# Patient Record
Sex: Male | Born: 1955
Health system: Southern US, Community
[De-identification: ages and names within clinical notes are randomized; demographics above are authoritative.]

## PROBLEM LIST (undated history)

## (undated) DIAGNOSIS — G35 Multiple sclerosis: Secondary | ICD-10-CM

## (undated) DIAGNOSIS — R569 Unspecified convulsions: Secondary | ICD-10-CM

## (undated) DIAGNOSIS — F32A Depression, unspecified: Secondary | ICD-10-CM

## (undated) DIAGNOSIS — I8002 Phlebitis and thrombophlebitis of superficial vessels of left lower extremity: Secondary | ICD-10-CM

## (undated) DIAGNOSIS — F329 Major depressive disorder, single episode, unspecified: Secondary | ICD-10-CM

## (undated) HISTORY — DX: Phlebitis and thrombophlebitis of superficial vessels of left lower extremity: I80.02

## (undated) HISTORY — PX: NASAL FRACTURE SURGERY: SHX718

## (undated) HISTORY — DX: Multiple sclerosis: G35

## (undated) HISTORY — DX: Major depressive disorder, single episode, unspecified: F32.9

## (undated) HISTORY — DX: Depression, unspecified: F32.A

## (undated) HISTORY — DX: Unspecified convulsions: R56.9

---

## 2003-04-24 ENCOUNTER — Inpatient Hospital Stay (HOSPITAL_COMMUNITY): Admission: EM | Admit: 2003-04-24 | Discharge: 2003-04-27 | Payer: Self-pay | Admitting: *Deleted

## 2005-04-24 ENCOUNTER — Encounter: Admission: RE | Admit: 2005-04-24 | Discharge: 2005-04-24 | Payer: Self-pay | Admitting: Neurology

## 2012-08-08 ENCOUNTER — Other Ambulatory Visit: Payer: Self-pay | Admitting: Neurology

## 2013-03-18 ENCOUNTER — Encounter: Payer: Self-pay | Admitting: Neurology

## 2013-03-28 ENCOUNTER — Encounter (INDEPENDENT_AMBULATORY_CARE_PROVIDER_SITE_OTHER): Payer: Self-pay

## 2013-03-28 ENCOUNTER — Ambulatory Visit (INDEPENDENT_AMBULATORY_CARE_PROVIDER_SITE_OTHER): Payer: BC Managed Care – PPO | Admitting: Neurology

## 2013-03-28 ENCOUNTER — Encounter: Payer: Self-pay | Admitting: Neurology

## 2013-03-28 VITALS — BP 127/67 | HR 64 | Wt 242.0 lb

## 2013-03-28 DIAGNOSIS — G35 Multiple sclerosis: Secondary | ICD-10-CM

## 2013-03-28 DIAGNOSIS — Z5181 Encounter for therapeutic drug level monitoring: Secondary | ICD-10-CM

## 2013-03-28 DIAGNOSIS — R569 Unspecified convulsions: Secondary | ICD-10-CM

## 2013-03-28 HISTORY — DX: Unspecified convulsions: R56.9

## 2013-03-28 MED ORDER — SERTRALINE HCL 100 MG PO TABS: 150.0000 mg | ORAL_TABLET | Freq: Every day | ORAL | Status: DC

## 2013-03-28 NOTE — Progress Notes (Signed)
Reason for visit: Multiple sclerosis  Troy Carney is an 57 y.o. male  History of present illness:  Troy Carney is a 58 year old right-handed white male with a history of multiple sclerosis. The patient has done quite well on Avonex injection since last seen. The patient has not had any injection site issues or other side effects on the medication. The patient reports no new neurologic complaints of numbness, weakness, balance issues, bowel or bladder control problems, or visual changes. The patient does report some fatigue at times, but this is not a significant issue for him, and he is able to perform all activities of daily living without problems. The patient sleeps well at night. The patient has not had any blood work done over the last one year. The patient returns for an evaluation. The patient has a history of dystonic seizures, and he is on Keppra without recurrence of the seizures.  Past Medical History  Diagnosis Date  . Multiple sclerosis   . Seizure     dystonic  . Seizures 03/28/2013  . Depression     Past Surgical History  Procedure Laterality Date  . Nasal fracture surgery      Family History  Problem Relation Age of Onset  . Bladder Cancer Father   . Testicular cancer Brother   . Asthma Father   . COPD Mother   . Multiple sclerosis Son   . Multiple sclerosis Son     Social history:  reports that he has never smoked. He has never used smokeless tobacco. He reports that he does not drink alcohol or use illicit drugs.   No Known Allergies  Medications:  Current Outpatient Prescriptions on File Prior to Visit  Medication Sig Dispense Refill  . aspirin 325 MG tablet Take 325 mg by mouth daily.      . interferon beta-1a (AVONEX) 30 MCG/0.5ML injection Inject 30 mcg into the muscle every 7 (seven) days.      Marland Kitchen levETIRAcetam (KEPPRA) 750 MG tablet Take 750 mg by mouth 2 (two) times daily. Brand medically necessary       No current facility-administered medications  on file prior to visit.    ROS:  Out of a complete 14 system review of symptoms, the patient complains only of the following symptoms, and all other reviewed systems are negative.  Back pain  Fatigue  Blood pressure 127/67, pulse 64, weight 242 lb (109.77 kg).  Physical Exam  General: The patient is alert and cooperative at the time of the examination. the patient is minimally obese.   Skin: 1+ edema at the ankles is noted bilaterally.    Neurologic Exam  Mental status: The patient is oriented x 3.  Cranial nerves: Facial symmetry is present. Speech is normal, no aphasia or dysarthria is noted. Extraocular movements are full. Visual fields are full. pupils are equal, round, and reactive to light. Discs are flat bilaterally.   Motor: The patient has good strength in all 4 extremities.  Sensory examination: Soft touch sensation on the face, arms, and legs is symmetric.  Coordination: The patient has good finger-nose-finger and heel-to-shin bilaterally.  Gait and station: The patient has a normal gait. Tandem gait is normal. Romberg is negative. No drift is seen.  Reflexes: Deep tendon reflexes are symmetric.   Assessment/Plan:  One. Multiple sclerosis  2. Dystonic seizures  The patient doing quite well at this point. The patient will remain on Avonex, and he will have blood work done today. A prescription was  given for the Zoloft. The patient will followup in one year. The patient will contact our office if new neurologic symptoms are noted. The patient has 2 sons with multiple sclerosis as well, and they are doing well on Gilenya.   Troy Alexanders MD 03/28/2013 1:47 PM  [facility] [address] [place], [place] [address]  Phone [phone] Fax [phone]

## 2013-03-28 NOTE — Patient Instructions (Signed)
Multiple Sclerosis Multiple sclerosis (MS) is a disease of the central nervous system. Its cause is unknown. It is more common in the northern states than in the southern states. There is a higher incidence of MS in women. There is a wide variation in the symptoms (problems) of MS. This is because of the many different ways it affects the central nervous system. It often comes on in episodes or attacks. These attacks may last weeks to months. There may be long periods of nearly no problems between attacks. The main symptoms include visual problems (associated with eye pain), numbness, weakness, and paralysis in extremities (arms/hands and legs/feet). There may also be tremors and problems with balance and walking. The age when MS starts is variable. Advances in medicine continue to improve the treatment of this illness. There is no known cure for MS but there are medications that help. MS is not an inherited illness, although your risk of getting this disease is higher if you have a relative with MS. The best radiologic (x-ray) study for MS is an MRI (magnetic resonance imaging). There are medications available to decrease the number and frequency of attacks. SYMPTOMS  The symptoms of MS are caused by loss of insulation (myelin) of the nerves of the brain. When this happens, brain signals do not get transmitted properly or may not get transmitted at all. Some of the problems caused by this include:   Numbness.  Weakness.  Paralysis in extremities.  Visual problems, eye pain.  Balance problems.  Tremors. DIAGNOSIS  Your caregiver can do studies on you to make this diagnosis. This may include specialized X-rays and spinal fluid studies. HOME CARE INSTRUCTIONS   Take medications as directed by your caregiver. Baclofen is a drug commonly used to reduce muscle spasticity. Steroids are often used for short term relief.  Exercise as directed.  Use physical and occupational therapy as directed by  your caregiver. Careful attention to this medical care can help avoid depression.  See your caregiver if you begin to have problems with depression. This is a common problem in MS. Patients often continue to work many years after the diagnosis of MS. Document Released: 03/07/2000 Document Revised: 06/02/2011 Document Reviewed: 10/14/2006 [employer] Patient Information 2014 [employer].  

## 2013-03-29 LAB — CBC WITH DIFFERENTIAL
BASOS ABS: 0 10*3/uL (ref 0.0–0.2)
Basos: 1 %
EOS ABS: 0.3 10*3/uL (ref 0.0–0.4)
EOS: 5 %
HCT: 41.6 % (ref 37.5–51.0)
Hemoglobin: 14.3 g/dL (ref 12.6–17.7)
Lymphocytes Absolute: 1 10*3/uL (ref 0.7–3.1)
Lymphs: 18 %
MCH: 29.9 pg (ref 26.6–33.0)
MCHC: 34.4 g/dL (ref 31.5–35.7)
MCV: 87 fL (ref 79–97)
MONOS ABS: 0.9 10*3/uL (ref 0.1–0.9)
Monocytes: 16 %
NEUTROS PCT: 60 %
Neutrophils Absolute: 3.5 10*3/uL (ref 1.4–7.0)
PLATELETS: 202 10*3/uL (ref 150–379)
RBC: 4.79 x10E6/uL (ref 4.14–5.80)
RDW: 13 % (ref 12.3–15.4)
WBC: 5.7 10*3/uL (ref 3.4–10.8)

## 2013-03-29 LAB — COMPREHENSIVE METABOLIC PANEL
ALK PHOS: 93 IU/L (ref 39–117)
ALT: 32 IU/L (ref 0–44)
AST: 25 IU/L (ref 0–40)
Albumin/Globulin Ratio: 1.8 (ref 1.1–2.5)
Albumin: 4 g/dL (ref 3.5–5.5)
BUN / CREAT RATIO: 16 (ref 9–20)
BUN: 14 mg/dL (ref 6–24)
CHLORIDE: 105 mmol/L (ref 96–108)
CO2: 26 mmol/L (ref 18–29)
CREATININE: 0.86 mg/dL (ref 0.76–1.27)
Calcium: 8.8 mg/dL (ref 8.7–10.2)
GFR calc Af Amer: 111 mL/min/{1.73_m2} (ref >59)
GFR calc non Af Amer: 96 mL/min/{1.73_m2} (ref >59)
GLOBULIN, TOTAL: 2.2 g/dL (ref 1.5–4.5)
Glucose: 100 mg/dL — ABNORMAL HIGH (ref 65–99)
Potassium: 4.1 mmol/L (ref 3.5–5.2)
SODIUM: 140 mmol/L (ref 134–144)
Total Bilirubin: 0.4 mg/dL (ref 0.0–1.2)
Total Protein: 6.2 g/dL (ref 6.0–8.5)

## 2013-03-30 NOTE — Progress Notes (Signed)
Quick Note:  Spoke to patient and relayed unremarkable blood work results, per Dr. Jannifer Franklin. ______

## 2013-04-07 ENCOUNTER — Encounter: Payer: Self-pay | Admitting: Neurology

## 2013-04-08 ENCOUNTER — Other Ambulatory Visit: Payer: Self-pay | Admitting: Neurology

## 2013-04-08 MED ORDER — LEVETIRACETAM 750 MG PO TABS: 750.0000 mg | ORAL_TABLET | Freq: Two times a day (BID) | ORAL | Status: DC

## 2013-10-01 ENCOUNTER — Other Ambulatory Visit: Payer: Self-pay | Admitting: Neurology

## 2013-11-15 ENCOUNTER — Other Ambulatory Visit: Payer: Self-pay | Admitting: Family Medicine

## 2013-11-15 DIAGNOSIS — R229 Localized swelling, mass and lump, unspecified: Principal | ICD-10-CM

## 2013-11-15 DIAGNOSIS — IMO0002 Reserved for concepts with insufficient information to code with codable children: Secondary | ICD-10-CM

## 2013-11-15 DIAGNOSIS — M7989 Other specified soft tissue disorders: Secondary | ICD-10-CM

## 2013-11-23 ENCOUNTER — Ambulatory Visit
Admission: RE | Admit: 2013-11-23 | Discharge: 2013-11-23 | Disposition: A | Discharge status: Home / Self Care | Payer: Managed Care, Other (non HMO) | Source: Ambulatory Visit | Attending: Family Medicine | Admitting: Family Medicine

## 2013-11-23 DIAGNOSIS — M7989 Other specified soft tissue disorders: Secondary | ICD-10-CM

## 2014-03-27 ENCOUNTER — Telehealth: Payer: Self-pay | Admitting: Neurology

## 2014-03-27 NOTE — Telephone Encounter (Signed)
The patient was recently seen by Marica Otter, the patient is on Gilenya, examination was unremarkable.

## 2014-03-28 ENCOUNTER — Ambulatory Visit (INDEPENDENT_AMBULATORY_CARE_PROVIDER_SITE_OTHER): Payer: Managed Care, Other (non HMO) | Admitting: Neurology

## 2014-03-28 ENCOUNTER — Encounter: Payer: Self-pay | Admitting: Neurology

## 2014-03-28 VITALS — BP 134/70 | HR 59 | Ht 70.0 in | Wt 243.6 lb

## 2014-03-28 DIAGNOSIS — G35 Multiple sclerosis: Secondary | ICD-10-CM

## 2014-03-28 DIAGNOSIS — R569 Unspecified convulsions: Secondary | ICD-10-CM

## 2014-03-28 MED ORDER — LEVETIRACETAM 750 MG PO TABS: 750.0000 mg | ORAL_TABLET | Freq: Two times a day (BID) | ORAL | Status: DC

## 2014-03-28 MED ORDER — SERTRALINE HCL 100 MG PO TABS: 150.0000 mg | ORAL_TABLET | Freq: Every day | ORAL | Status: DC

## 2014-03-28 NOTE — Patient Instructions (Signed)

## 2014-03-28 NOTE — Progress Notes (Signed)
Reason for visit: Multiple sclerosis, seizures  Troy Carney is an 59 y.o. male  History of present illness:  Troy Carney is a 59 year old right-handed white male with multiple sclerosis and seizures. The patient has done well with both of these medical issues. He has not had any clinical exacerbations of his multiple sclerosis over the last year. He recently went to his optometrist, and his eye examination was unremarkable. He remains on Keppra for the seizures, and he has not had any recent recurrence of this. 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 He denies any significant issues with vision, numbness or weakness of the extremities, or difficulty with balance. He denies any visual issues. He has a history of depression, and he is on Zoloft for this. He remains on Avonex, and he is tolerating the medication well.  Past Medical History  Diagnosis Date  . Multiple sclerosis   . Seizure     dystonic  . Seizures 03/28/2013  . Depression     Past Surgical History  Procedure Laterality Date  . Nasal fracture surgery      Family History  Problem Relation Age of Onset  . Bladder Cancer Father   . Testicular cancer Brother   . Asthma Father   . COPD Mother   . Multiple sclerosis Son   . Multiple sclerosis Son     Social history:  reports that he has never smoked. He has never used smokeless tobacco. He reports that he does not drink alcohol or use illicit drugs.   No Known Allergies  Medications:  Current Outpatient Prescriptions on File Prior to Visit  Medication Sig Dispense Refill  . aspirin 325 MG tablet Take 325 mg by mouth daily.    . interferon beta-1a (AVONEX) 30 MCG/0.5ML injection Inject 30 mcg into the muscle every 7 (seven) days.     No current facility-administered medications on file prior to visit.    ROS:  Out of a complete 14 system review of symptoms, the patient complains only of the following symptoms, and all other reviewed systems are  negative.  Cough  Blood pressure 134/70, pulse 59, height 5\' 10"  (1.778 m), weight 243 lb 9.6 oz (110.496 kg).  Physical Exam  General: The patient is alert and cooperative at the time of the examination. The patient is moderately obese.  Skin: No significant peripheral edema is noted.   Neurologic Exam  Mental status: The patient is oriented x 3.  Cranial nerves: Facial symmetry is present. Speech is normal, no aphasia or dysarthria is noted. Extraocular movements are full. Visual fields are full. Pupils are equal, round, and reactive to light. Discs are flat bilaterally.  Motor: The patient has good strength in all 4 extremities.  Sensory examination: Soft touch sensation is symmetric on the face, arms, and legs.  Coordination: The patient has good finger-nose-finger and heel-to-shin bilaterally.  Gait and station: The patient has a normal gait. Tandem gait is normal. Romberg is negative. No drift is seen.  Reflexes: Deep tendon reflexes are symmetric.   Assessment/Plan:  1. Multiple sclerosis  2. Seizures  3. Depression  The patient is doing relatively well currently. We will continue the Zoloft, Avonex, and Keppra. He will have blood work done today, and he will be set up for a repeat MRI of the brain for follow-up. The last MRI the brain was done in 2007. He will follow-up in one year or sooner if needed.  Jill Alexanders MD 03/28/2014 7:03 PM  [facility] [address] [place], [place] [address]  Phone [phone] Fax [phone]

## 2014-03-29 LAB — COMPREHENSIVE METABOLIC PANEL WITH GFR
ALT: 38 [IU]/L (ref 0–44)
AST: 29 [IU]/L (ref 0–40)
Albumin/Globulin Ratio: 1.6 (ref 1.1–2.5)
Albumin: 4.3 g/dL (ref 3.5–5.5)
Alkaline Phosphatase: 110 [IU]/L (ref 39–117)
BUN/Creatinine Ratio: 11 (ref 9–20)
BUN: 10 mg/dL (ref 6–24)
CO2: 25 mmol/L (ref 18–29)
Calcium: 9.1 mg/dL (ref 8.7–10.2)
Chloride: 103 mmol/L (ref 97–108)
Creatinine, Ser: 0.95 mg/dL (ref 0.76–1.27)
GFR calc Af Amer: 102 mL/min/{1.73_m2}
GFR calc non Af Amer: 88 mL/min/{1.73_m2}
Globulin, Total: 2.7 g/dL (ref 1.5–4.5)
Glucose: 86 mg/dL (ref 65–99)
Potassium: 4.3 mmol/L (ref 3.5–5.2)
Sodium: 141 mmol/L (ref 134–144)
Total Bilirubin: 0.4 mg/dL (ref 0.0–1.2)
Total Protein: 7 g/dL (ref 6.0–8.5)

## 2014-03-29 LAB — CBC WITH DIFFERENTIAL
Basophils Absolute: 0 10*3/uL (ref 0.0–0.2)
Basos: 1 %
Eos: 4 %
Eosinophils Absolute: 0.3 10*3/uL (ref 0.0–0.4)
HCT: 45.3 % (ref 37.5–51.0)
Hemoglobin: 15.3 g/dL (ref 12.6–17.7)
Immature Grans (Abs): 0 10*3/uL (ref 0.0–0.1)
Immature Granulocytes: 0 %
Lymphocytes Absolute: 1.9 10*3/uL (ref 0.7–3.1)
Lymphs: 30 %
MCH: 29.5 pg (ref 26.6–33.0)
MCHC: 33.8 g/dL (ref 31.5–35.7)
MCV: 88 fL (ref 79–97)
Monocytes Absolute: 1.2 10*3/uL — ABNORMAL HIGH (ref 0.1–0.9)
Monocytes: 19 %
Neutrophils Absolute: 2.9 10*3/uL (ref 1.4–7.0)
Neutrophils Relative %: 46 %
Platelets: 231 10*3/uL (ref 150–379)
RBC: 5.18 x10E6/uL (ref 4.14–5.80)
RDW: 13.5 % (ref 12.3–15.4)
WBC: 6.3 10*3/uL (ref 3.4–10.8)

## 2014-04-06 ENCOUNTER — Ambulatory Visit (INDEPENDENT_AMBULATORY_CARE_PROVIDER_SITE_OTHER): Payer: Managed Care, Other (non HMO)

## 2014-04-06 DIAGNOSIS — R569 Unspecified convulsions: Secondary | ICD-10-CM

## 2014-04-06 DIAGNOSIS — G35 Multiple sclerosis: Secondary | ICD-10-CM

## 2014-04-07 MED ORDER — GADOPENTETATE DIMEGLUMINE 469.01 MG/ML IV SOLN: 20.0000 mL | Freq: Once | INTRAVENOUS | Status: AC | PRN

## 2014-04-08 ENCOUNTER — Telehealth: Payer: Self-pay | Admitting: Neurology

## 2014-04-08 NOTE — Telephone Encounter (Signed)
The MRI of the brain appears to be stable from the prior study. Results sent to patient by Mychart.   MRI brain 04/07/14:  IMPRESSION:  Mildly abnormal MRI brain (with and without) demonstrating: 1. Few small periventricular and subcortical foci of T2 hyperintensities, consistent with chronic multiple sclerosis. No abnormal lesions are seen on post contrast views.  2. No significant change from MRI on 04/24/05.

## 2014-06-03 ENCOUNTER — Other Ambulatory Visit: Payer: Self-pay | Admitting: Neurology

## 2014-10-02 ENCOUNTER — Other Ambulatory Visit: Payer: Self-pay

## 2014-10-02 MED ORDER — INTERFERON BETA-1A 30 MCG/0.5ML IM PSKT: 30.0000 ug | PREFILLED_SYRINGE | INTRAMUSCULAR | Status: DC

## 2015-03-05 ENCOUNTER — Telehealth: Payer: Self-pay | Admitting: Neurology

## 2015-03-05 NOTE — Telephone Encounter (Signed)
The patient was seen by Dr. Marica Otter, ophthalmologic evaluation showed no neurologic changes in the retina or disc associated with MS. Examination date was on 02/28/2015.

## 2015-03-16 ENCOUNTER — Other Ambulatory Visit: Payer: Self-pay | Admitting: Neurology

## 2015-03-17 ENCOUNTER — Other Ambulatory Visit: Payer: Self-pay | Admitting: Neurology

## 2015-03-29 ENCOUNTER — Encounter: Payer: Self-pay | Admitting: Neurology

## 2015-03-29 ENCOUNTER — Ambulatory Visit (INDEPENDENT_AMBULATORY_CARE_PROVIDER_SITE_OTHER): Payer: Managed Care, Other (non HMO) | Admitting: Neurology

## 2015-03-29 VITALS — BP 132/72 | HR 59 | Temp 97.1°F | Resp 18 | Ht 70.0 in | Wt 242.0 lb

## 2015-03-29 DIAGNOSIS — S39012A Strain of muscle, fascia and tendon of lower back, initial encounter: Secondary | ICD-10-CM | POA: Diagnosis not present

## 2015-03-29 DIAGNOSIS — R569 Unspecified convulsions: Secondary | ICD-10-CM | POA: Diagnosis not present

## 2015-03-29 DIAGNOSIS — Z5181 Encounter for therapeutic drug level monitoring: Secondary | ICD-10-CM | POA: Diagnosis not present

## 2015-03-29 DIAGNOSIS — G35 Multiple sclerosis: Secondary | ICD-10-CM

## 2015-03-29 MED ORDER — SERTRALINE HCL 100 MG PO TABS: 150.0000 mg | ORAL_TABLET | Freq: Every day | ORAL | Status: DC

## 2015-03-29 MED ORDER — LEVETIRACETAM 750 MG PO TABS: 750.0000 mg | ORAL_TABLET | Freq: Two times a day (BID) | ORAL | Status: DC

## 2015-03-29 MED ORDER — METHOCARBAMOL 500 MG PO TABS: 500.0000 mg | ORAL_TABLET | Freq: Three times a day (TID) | ORAL | Status: DC

## 2015-03-29 NOTE — Progress Notes (Signed)
Reason for visit: Multiple sclerosis  Troy Carney is an 60 y.o. male  History of present illness:  Troy Carney is a 60 year old right-handed white male with a history of multiple sclerosis and a history of seizures. The patient indicates that he has done well over the last year with both of these issues. He has not had any new numbness, weakness, gait disturbance, or visual complaints. He was seen by his optometrist on 02/28/2015, the examination was unremarkable. The patient remains on Avonex, he is tolerating the medication well. He reports no further seizure events, he is operating a motor vehicle. The patient reports some mild fatigue problems. Occasionally, he may stumble, but he has not had any falls. Two weeks ago, he strained his low back, and the pain has continued. He denies any discomfort down either leg, but the pain is in the back. He has some back stiffness. He takes Advil on occasion for discomfort. He returns for an evaluation.  Past Medical History  Diagnosis Date  . Multiple sclerosis ([place])   . Seizure ([place])     dystonic  . Seizures ([place]) 03/28/2013  . Depression     Past Surgical History  Procedure Laterality Date  . Nasal fracture surgery      Family History  Problem Relation Age of Onset  . Bladder Cancer Father   . Testicular cancer Brother   . Asthma Father   . COPD Mother   . Multiple sclerosis Son   . Multiple sclerosis Son     Social history:  reports that he has never smoked. He has never used smokeless tobacco. He reports that he does not drink alcohol or use illicit drugs.   No Known Allergies  Medications:  Prior to Admission medications   Medication Sig Start Date End Date Taking? Authorizing Provider  aspirin 325 MG tablet Take 325 mg by mouth daily.   Yes Historical Provider, MD  interferon beta-1a (AVONEX PREFILLED) 30 MCG/0.5ML PSKT injection Inject 0.5 mLs (30 mcg total) into the muscle every 7 (seven) days. 10/02/14  Yes Kathrynn Ducking,  MD  levETIRAcetam (KEPPRA) 750 MG tablet TAKE 1 TABLET BY MOUTH TWICE DAILY 03/17/15  Yes Kathrynn Ducking, MD  sertraline (ZOLOFT) 100 MG tablet Take 1.5 tablets (150 mg total) by mouth daily. 03/28/14  Yes Kathrynn Ducking, MD    ROS:  Out of a complete 14 system review of symptoms, the patient complains only of the following symptoms, and all other reviewed systems are negative.  Back pain  Blood pressure 132/72, pulse 59, temperature 97.1 F (36.2 C), resp. rate 18, height 5\' 10"  (1.778 m), weight 242 lb (109.77 kg).  Physical Exam  General: The patient is alert and cooperative at the time of the examination. The patient is moderately obese.  Skin: No significant peripheral edema is noted.   Neurologic Exam  Mental status: The patient is alert and oriented x 3 at the time of the examination. The patient has apparent normal recent and remote memory, with an apparently normal attention span and concentration ability.   Cranial nerves: Facial symmetry is present. Speech is normal, no aphasia or dysarthria is noted. Extraocular movements are full. Visual fields are full. Pupils are equal, round, and reactive to light. Discs are flat bilaterally.  Motor: The patient has good strength in all 4 extremities.  Sensory examination: Soft touch sensation is symmetric on the face, arms, and legs.  Coordination: The patient has good finger-nose-finger and heel-to-shin bilaterally.  Gait and station: The patient has a normal gait. Tandem gait is normal. Romberg is negative. No drift is seen.  Reflexes: Deep tendon reflexes are symmetric.   Assessment/Plan:  1. Multiple sclerosis  2. History of seizures, well controlled  3. Lumbar strain, recent onset  The patient is doing well with his multiple sclerosis, we will continue Avonex for now. The patient was called in a prescription for his Keppra and for his Zoloft. The patient has recently injured his low back, he has ongoing back  pain. He will be placed on Robaxin 500 mg 3 times daily for the next 10 days, he will take Advil on a scheduled basis taking 600 mg 3 times a day with meals. He will contact me if the back pain continues, we will consider physical therapy at that time. He will follow-up in one year, sooner if needed. Blood work will be done today.  Jill Alexanders MD 03/29/2015 6:48 PM  [facility] [address] [place], [place] [address]  Phone [phone] Fax [phone]

## 2015-03-29 NOTE — Patient Instructions (Signed)
Multiple Sclerosis °Multiple sclerosis (MS) is a disease of the central nervous system. It leads to the loss of the insulating covering of the nerves (myelin sheath) of your brain. When this happens, brain signals do not get sent properly or may not get sent at all. The age of onset of MS varies.  °CAUSES °The cause of MS is unknown. However, it is more common in the northern [place] than in the southern [place]. °RISK FACTORS °There is a higher number of women with MS than men. MS is not an illness that is passed down to you from your family members (inherited). However, your risk of MS is higher if you have a relative with MS. °SIGNS AND SYMPTOMS  °The symptoms of MS occur in episodes or attacks. These attacks may last weeks to months. There may be long periods of almost no symptoms between attacks. The symptoms of MS vary. This is because of the many different ways it affects the central nervous system. The main symptoms of MS include: °· Vision problems and eye pain. °· Numbness. °· Weakness. °· Inability to move your arms, hands, feet, or legs (paralysis). °· Balance problems. °· Tremors. °DIAGNOSIS  °Your health care provider can diagnose MS with the help of imaging exams and lab tests. These may include specialized X-ray exams and spinal fluid tests. The best imaging exam to confirm a diagnosis of MS is an MRI. °TREATMENT  °There is no known cure for MS, but there are medicines that can decrease the number and frequency of attacks. Steroids are often used for short-term relief. Physical and occupational therapy may also help. There are also many new alternative or complementary treatments available to help control the symptoms of MS. Ask your health care provider if any of these other options are right for you. °HOME CARE INSTRUCTIONS  °· Take medicines as directed by your health care provider. °· Exercise as directed by your health care provider. °SEEK MEDICAL CARE IF: °You begin to feel  depressed. °SEEK IMMEDIATE MEDICAL CARE IF: °· You develop paralysis. °· You have problems with bladder, bowel, or sexual function. °· You develop mental changes, such as forgetfulness or mood swings. °· You have a period of uncontrolled movements (seizure). °  °This information is not intended to replace advice given to you by your health care provider. Make sure you discuss any questions you have with your health care provider. °  °Document Released: 03/07/2000 Document Revised: 03/15/2013 Document Reviewed: 11/15/2012 °Elsevier Interactive Patient Education ©2016 [employer] ° °

## 2015-03-30 LAB — CBC WITH DIFFERENTIAL/PLATELET
BASOS ABS: 0 10*3/uL (ref 0.0–0.2)
BASOS: 1 %
EOS (ABSOLUTE): 0.4 10*3/uL (ref 0.0–0.4)
Eos: 5 %
Hematocrit: 44.8 % (ref 37.5–51.0)
Hemoglobin: 14.8 g/dL (ref 12.6–17.7)
Immature Grans (Abs): 0 10*3/uL (ref 0.0–0.1)
Immature Granulocytes: 1 %
LYMPHS ABS: 2 10*3/uL (ref 0.7–3.1)
Lymphs: 31 %
MCH: 29.6 pg (ref 26.6–33.0)
MCHC: 33 g/dL (ref 31.5–35.7)
MCV: 90 fL (ref 79–97)
MONOS ABS: 0.7 10*3/uL (ref 0.1–0.9)
Monocytes: 11 %
NEUTROS ABS: 3.4 10*3/uL (ref 1.4–7.0)
Neutrophils: 51 %
PLATELETS: 229 10*3/uL (ref 150–379)
RBC: 5 x10E6/uL (ref 4.14–5.80)
RDW: 13.4 % (ref 12.3–15.4)
WBC: 6.5 10*3/uL (ref 3.4–10.8)

## 2015-03-30 LAB — COMPREHENSIVE METABOLIC PANEL
ALK PHOS: 95 IU/L (ref 39–117)
ALT: 30 IU/L (ref 0–44)
AST: 21 IU/L (ref 0–40)
Albumin/Globulin Ratio: 1.9 (ref 1.1–2.5)
Albumin: 4.2 g/dL (ref 3.5–5.5)
BILIRUBIN TOTAL: 0.4 mg/dL (ref 0.0–1.2)
BUN/Creatinine Ratio: 19 (ref 9–20)
BUN: 16 mg/dL (ref 6–24)
CHLORIDE: 99 mmol/L (ref 96–106)
CO2: 23 mmol/L (ref 18–29)
Calcium: 9.1 mg/dL (ref 8.7–10.2)
Creatinine, Ser: 0.85 mg/dL (ref 0.76–1.27)
GFR calc non Af Amer: 95 mL/min/{1.73_m2} (ref >59)
GFR, EST AFRICAN AMERICAN: 110 mL/min/{1.73_m2} (ref >59)
GLUCOSE: 90 mg/dL (ref 65–99)
Globulin, Total: 2.2 g/dL (ref 1.5–4.5)
Potassium: 4.1 mmol/L (ref 3.5–5.2)
Sodium: 139 mmol/L (ref 134–144)
TOTAL PROTEIN: 6.4 g/dL (ref 6.0–8.5)

## 2015-05-28 ENCOUNTER — Other Ambulatory Visit: Payer: Self-pay | Admitting: *Deleted

## 2015-05-28 MED ORDER — INTERFERON BETA-1A 30 MCG/0.5ML IM PSKT: 30.0000 ug | PREFILLED_SYRINGE | INTRAMUSCULAR | Status: DC

## 2015-05-28 NOTE — Telephone Encounter (Signed)
Avonex r/f per faxed request/fim

## 2015-11-21 ENCOUNTER — Other Ambulatory Visit: Payer: Self-pay | Admitting: *Deleted

## 2015-11-21 MED ORDER — INTERFERON BETA-1A 30 MCG/0.5ML IM PSKT: 30.0000 ug | PREFILLED_SYRINGE | INTRAMUSCULAR | 1 refills | Status: DC

## 2016-03-28 ENCOUNTER — Ambulatory Visit (INDEPENDENT_AMBULATORY_CARE_PROVIDER_SITE_OTHER): Payer: Managed Care, Other (non HMO) | Admitting: Neurology

## 2016-03-28 ENCOUNTER — Encounter: Payer: Self-pay | Admitting: Neurology

## 2016-03-28 VITALS — Ht 70.0 in | Wt 241.5 lb

## 2016-03-28 DIAGNOSIS — G35 Multiple sclerosis: Secondary | ICD-10-CM

## 2016-03-28 DIAGNOSIS — R569 Unspecified convulsions: Secondary | ICD-10-CM

## 2016-03-28 DIAGNOSIS — Z5181 Encounter for therapeutic drug level monitoring: Secondary | ICD-10-CM | POA: Diagnosis not present

## 2016-03-28 MED ORDER — LEVETIRACETAM 750 MG PO TABS: 750.0000 mg | ORAL_TABLET | Freq: Two times a day (BID) | ORAL | 3 refills | Status: DC

## 2016-03-28 MED ORDER — SERTRALINE HCL 100 MG PO TABS: 150.0000 mg | ORAL_TABLET | Freq: Every day | ORAL | 3 refills | Status: DC

## 2016-03-28 NOTE — Progress Notes (Signed)
Reason for visit: Multiple sclerosis, seizures  Troy Carney is an 61 y.o. male  History of present illness:  Troy Carney is a 61 year old right-handed white male with a history of seizures that have been well controlled. The patient is on Keppra and he is tolerating the medication well. He has not had any recurrence since last seen, he does operate a motor vehicle. The patient has a history multiple sclerosis that has been very stable on Avonex. The patient is tolerating this medication well. He denies any new symptoms of numbness, weakness, balance issues, or changes in mentation. His overall energy level is relatively good. He returns for routine reevaluation. He has had a recent ophthalmologic evaluation.    Past Medical History:  Diagnosis Date  . Depression   . Multiple sclerosis ([place])   . Seizures ([place]) 03/28/2013   Dystonic    Past Surgical History:  Procedure Laterality Date  . NASAL FRACTURE SURGERY      Family History  Problem Relation Age of Onset  . COPD Mother   . Bladder Cancer Father   . Asthma Father   . Testicular cancer Brother   . Multiple sclerosis Son   . Multiple sclerosis Son     Social history:  reports that he has never smoked. He has never used smokeless tobacco. He reports that he does not drink alcohol or use drugs.   No Known Allergies  Medications:  Prior to Admission medications   Medication Sig Start Date End Date Taking? Authorizing Provider  aspirin 81 MG tablet Take 81 mg by mouth daily.    Yes Historical Provider, MD  interferon beta-1a (AVONEX PREFILLED) 30 MCG/0.5ML PSKT injection Inject 0.5 mLs (30 mcg total) into the muscle every 7 (seven) days. 11/21/15  Yes Kathrynn Ducking, MD  levETIRAcetam (KEPPRA) 750 MG tablet Take 1 tablet (750 mg total) by mouth 2 (two) times daily. 03/28/16  Yes Kathrynn Ducking, MD  sertraline (ZOLOFT) 100 MG tablet Take 1.5 tablets (150 mg total) by mouth daily. 03/28/16  Yes Kathrynn Ducking, MD     ROS:  Out of a complete 14 system review of symptoms, the patient complains only of the following symptoms, and all other reviewed systems are negative.  Food allergies Snoring  Height 5\' 10"  (1.778 m), weight 241 lb 8 oz (109.5 kg).  Physical Exam  General: The patient is alert and cooperative at the time of the examination. The patient is moderately obese.  Skin: No significant peripheral edema is noted.   Neurologic Exam  Mental status: The patient is alert and oriented x 3 at the time of the examination. The patient has apparent normal recent and remote memory, with an apparently normal attention span and concentration ability.   Cranial nerves: Facial symmetry is present. Speech is normal, no aphasia or dysarthria is noted. Extraocular movements are full. Visual fields are full. Pupils are equal, round, and reactive to light. Discs are flat bilaterally.  Motor: The patient has good strength in all 4 extremities.  Sensory examination: Soft touch sensation is symmetric on the face, arms, and legs.  Coordination: The patient has good finger-nose-finger and heel-to-shin bilaterally.  Gait and station: The patient has a normal gait. Tandem gait is normal. Romberg is negative. No drift is seen.  Reflexes: Deep tendon reflexes are symmetric.   Assessment/Plan:  1. Multiple sclerosis  2. Seizures  The patient has been doing quite well with both the multiple sclerosis and the seizure control. The  patient will be given prescriptions for Keppra and Zoloft, he will continue the Avonex. He will follow-up in one year, sooner if needed. Blood work will be done today.  Jill Alexanders MD 03/28/2016 8:18 AM  [facility] [address] [place], [place] [address]  Phone [phone] Fax [phone]

## 2016-03-29 LAB — COMPREHENSIVE METABOLIC PANEL
A/G RATIO: 1.5 (ref 1.2–2.2)
ALT: 29 IU/L (ref 0–44)
AST: 23 IU/L (ref 0–40)
Albumin: 4.1 g/dL (ref 3.6–4.8)
Alkaline Phosphatase: 94 IU/L (ref 39–117)
BILIRUBIN TOTAL: 0.3 mg/dL (ref 0.0–1.2)
BUN/Creatinine Ratio: 24 (ref 10–24)
BUN: 21 mg/dL (ref 8–27)
CHLORIDE: 102 mmol/L (ref 96–106)
CO2: 23 mmol/L (ref 18–29)
Calcium: 9.4 mg/dL (ref 8.6–10.2)
Creatinine, Ser: 0.86 mg/dL (ref 0.76–1.27)
GFR calc non Af Amer: 94 mL/min/{1.73_m2} (ref >59)
GFR, EST AFRICAN AMERICAN: 109 mL/min/{1.73_m2} (ref >59)
GLOBULIN, TOTAL: 2.7 g/dL (ref 1.5–4.5)
Glucose: 83 mg/dL (ref 65–99)
POTASSIUM: 4.2 mmol/L (ref 3.5–5.2)
SODIUM: 141 mmol/L (ref 134–144)
TOTAL PROTEIN: 6.8 g/dL (ref 6.0–8.5)

## 2016-03-29 LAB — CBC WITH DIFFERENTIAL/PLATELET
BASOS ABS: 0 10*3/uL (ref 0.0–0.2)
Basos: 1 %
EOS (ABSOLUTE): 0.3 10*3/uL (ref 0.0–0.4)
Eos: 5 %
Hematocrit: 45.4 % (ref 37.5–51.0)
Hemoglobin: 15 g/dL (ref 13.0–17.7)
IMMATURE GRANS (ABS): 0 10*3/uL (ref 0.0–0.1)
Immature Granulocytes: 1 %
LYMPHS ABS: 1.9 10*3/uL (ref 0.7–3.1)
LYMPHS: 29 %
MCH: 29.9 pg (ref 26.6–33.0)
MCHC: 33 g/dL (ref 31.5–35.7)
MCV: 91 fL (ref 79–97)
MONOS ABS: 0.8 10*3/uL (ref 0.1–0.9)
Monocytes: 12 %
NEUTROS ABS: 3.3 10*3/uL (ref 1.4–7.0)
Neutrophils: 52 %
PLATELETS: 228 10*3/uL (ref 150–379)
RBC: 5.01 x10E6/uL (ref 4.14–5.80)
RDW: 13.7 % (ref 12.3–15.4)
WBC: 6.3 10*3/uL (ref 3.4–10.8)

## 2016-05-15 ENCOUNTER — Other Ambulatory Visit: Payer: Self-pay | Admitting: Neurology

## 2017-03-28 ENCOUNTER — Other Ambulatory Visit: Payer: Self-pay | Admitting: Neurology

## 2017-03-30 ENCOUNTER — Ambulatory Visit (INDEPENDENT_AMBULATORY_CARE_PROVIDER_SITE_OTHER): Payer: Managed Care, Other (non HMO) | Admitting: Adult Health

## 2017-03-30 ENCOUNTER — Encounter: Payer: Self-pay | Admitting: Adult Health

## 2017-03-30 VITALS — BP 137/70 | HR 55 | Ht 70.0 in | Wt 240.0 lb

## 2017-03-30 DIAGNOSIS — R569 Unspecified convulsions: Secondary | ICD-10-CM

## 2017-03-30 DIAGNOSIS — G35 Multiple sclerosis: Secondary | ICD-10-CM | POA: Diagnosis not present

## 2017-03-30 MED ORDER — INTERFERON BETA-1A 30 MCG/0.5ML IM PSKT: 30.0000 ug | PREFILLED_SYRINGE | INTRAMUSCULAR | 3 refills | Status: DC

## 2017-03-30 MED ORDER — SERTRALINE HCL 100 MG PO TABS: 150.0000 mg | ORAL_TABLET | Freq: Every day | ORAL | 3 refills | Status: DC

## 2017-03-30 MED ORDER — LEVETIRACETAM 750 MG PO TABS: 750.0000 mg | ORAL_TABLET | Freq: Two times a day (BID) | ORAL | 3 refills | Status: DC

## 2017-03-30 NOTE — Patient Instructions (Addendum)
Your Plan:  Continue Keppra for seizures Continue avonex for Multiple sclerosis MRI brain Blood work today If your symptoms worsen or you develop new symptoms please let us know.   Thank you for coming to see [place] at [facility]. I hope we have been able to provide you high quality care today.  You may receive a patient satisfaction survey over the next few weeks. We would appreciate your feedback and comments so that we may continue to improve ourselves and the health of our patients.

## 2017-03-30 NOTE — Progress Notes (Signed)
I have read the note, and I agree with the clinical assessment and plan.  Lonny Eisen K Rupert Azzara   

## 2017-03-30 NOTE — Progress Notes (Signed)
PATIENT: Troy Carney DOB: 05-13-1955  REASON FOR VISIT: follow up- seizures, multiple sclerosis HISTORY FROM: patient  HISTORY OF PRESENT ILLNESS: Today 03/30/17 Troy Carney is a 62 year old male with a history of seizures and multiple sclerosis.  He returns today for follow-up.  He remains on Keppra for seizure prevention.  He denies any seizure events.  He operates a Teacher, music without difficulty.  He is able to complete all ADLs independently.  He continues on Avonex for multiple sclerosis.  He denies any new numbness or weakness.  Denies any changes in his gait or balance.  Denies any changes with bowel or bladder.  No change in mood or behavior.  He remains on Zoloft 150 mg daily.  No change in his vision.  Overall he feels that he is doing well.  His last MRI of the brain was in 2016.  He returns today for evaluation.   HISTORY Troy Carney is a 62 year old right-handed white male with a history of seizures that have been well controlled. The patient is on Keppra and he is tolerating the medication well. He has not had any recurrence since last seen, he does operate a motor vehicle. The patient has a history multiple sclerosis that has been very stable on Avonex. The patient is tolerating this medication well. He denies any new symptoms of numbness, weakness, balance issues, or changes in mentation. His overall energy level is relatively good. He returns for routine reevaluation. He has had a recent ophthalmologic evaluation.  REVIEW OF SYSTEMS: Out of a complete 14 system review of symptoms, the patient complains only of the following symptoms, and all other reviewed systems are negative.  Snoring  ALLERGIES: No Known Allergies  HOME MEDICATIONS: Outpatient Medications Prior to Visit  Medication Sig Dispense Refill  . aspirin 81 MG tablet Take 81 mg by mouth daily.     Elisha Ponder PREFILLED 30 MCG/0.5ML PSKT injection INJECT 0.5 MLS (30 MCG TOTAL) INTO THE MUSCLE EVERY 7 (SEVEN) DAYS. 3  each 3  . levETIRAcetam (KEPPRA) 750 MG tablet Take 1 tablet (750 mg total) by mouth 2 (two) times daily. 180 tablet 3  . sertraline (ZOLOFT) 100 MG tablet Take 1.5 tablets (150 mg total) by mouth daily. 150 tablet 3   No facility-administered medications prior to visit.     PAST MEDICAL HISTORY: Past Medical History:  Diagnosis Date  . Depression   . Multiple sclerosis ([place])   . Seizures ([place]) 03/28/2013   Dystonic    PAST SURGICAL HISTORY: Past Surgical History:  Procedure Laterality Date  . NASAL FRACTURE SURGERY      FAMILY HISTORY: Family History  Problem Relation Age of Onset  . COPD Mother   . Bladder Cancer Father   . Asthma Father   . Testicular cancer Brother   . Multiple sclerosis Son   . Multiple sclerosis Son     SOCIAL HISTORY: Social History   Socioeconomic History  . Marital status: Married    Spouse name: [name]  . Number of children: 2  . Years of education: 50  . Highest education level: Not on file  Social Needs  . Financial resource strain: Not on file  . Food insecurity - worry: Not on file  . Food insecurity - inability: Not on file  . Transportation needs - medical: Not on file  . Transportation needs - non-medical: Not on file  Occupational History  . Occupation: Chief Financial Officer  Tobacco Use  . Smoking status: Never Smoker  .  Smokeless tobacco: Never Used  Substance and Sexual Activity  . Alcohol use: No  . Drug use: No  . Sexual activity: Not on file  Other Topics Concern  . Not on file  Social History Narrative   Patient is married [name]) and lives at home with his wife.   Patient has two children.   Patient is an Furniture conservator/restorer.   Patient is right-handed.   Patient drinks three caffeine drinks daily.      PHYSICAL EXAM  Vitals:   03/30/17 0724  BP: 137/70  Pulse: (!) 55  Weight: 240 lb (108.9 kg)  Height: 5\' 10"  (1.778 m)   Body mass index is 34.44 kg/m.  Generalized: Well developed, in no acute  distress   Neurological examination  Mentation: Alert oriented to time, place, history taking. Follows all commands speech and language fluent Cranial nerve II-XII: Pupils were equal round reactive to light. Extraocular movements were full, visual field were full on confrontational test. Facial sensation and strength were normal. Uvula tongue midline. Head turning and shoulder shrug  were normal and symmetric. Motor: The motor testing reveals 5 over 5 strength of all 4 extremities. Good symmetric motor tone is noted throughout.  Sensory: Sensory testing is intact to soft touch on all 4 extremities. No evidence of extinction is noted.  Coordination: Cerebellar testing reveals good finger-nose-finger and heel-to-shin bilaterally.  Gait and station: Gait is normal. Tandem gait is normal. Romberg is negative. No drift is seen.  Reflexes: Deep tendon reflexes are symmetric and normal bilaterally.   DIAGNOSTIC DATA (LABS, IMAGING, TESTING) - I reviewed patient records, labs, notes, testing and imaging myself where available.  Lab Results  Component Value Date   WBC 6.3 03/28/2016   HGB 15.0 03/28/2016   HCT 45.4 03/28/2016   MCV 91 03/28/2016   PLT 228 03/28/2016      Component Value Date/Time   NA 141 03/28/2016 0822   K 4.2 03/28/2016 0822   CL 102 03/28/2016 0822   CO2 23 03/28/2016 0822   GLUCOSE 83 03/28/2016 0822   BUN 21 03/28/2016 0822   CREATININE 0.86 03/28/2016 0822   CALCIUM 9.4 03/28/2016 0822   PROT 6.8 03/28/2016 0822   ALBUMIN 4.1 03/28/2016 0822   AST 23 03/28/2016 0822   ALT 29 03/28/2016 0822   ALKPHOS 94 03/28/2016 0822   BILITOT 0.3 03/28/2016 0822   GFRNONAA 94 03/28/2016 0822   GFRAA 109 03/28/2016 0822      ASSESSMENT AND PLAN 62 y.o. year old male  has a past medical history of Depression, Multiple sclerosis ([place]), and Seizures ([place]) (03/28/2013). here with:  1.  Multiple sclerosis  2.  Seizures  Overall the patient is doing well.  He will remain  on Keppra, Avonex and Zoloft.  We will repeat an MRI of the brain to look for any new lesions related to multiple sclerosis.  We will complete blood work today as well.  Advised that if his symptoms worsen or he develops new symptoms he should let us know.  He will follow-up in 1 year or sooner if needed.   Ward Givens, MSN, NP-C 03/30/2017, 7:01 AM [facility] [address] [place], [place] [address] [phone]

## 2017-03-31 ENCOUNTER — Telehealth: Payer: Self-pay | Admitting: *Deleted

## 2017-03-31 LAB — COMPREHENSIVE METABOLIC PANEL
A/G RATIO: 1.6 (ref 1.2–2.2)
ALK PHOS: 94 IU/L (ref 39–117)
ALT: 34 IU/L (ref 0–44)
AST: 29 IU/L (ref 0–40)
Albumin: 4.4 g/dL (ref 3.6–4.8)
BILIRUBIN TOTAL: 0.3 mg/dL (ref 0.0–1.2)
BUN/Creatinine Ratio: 16 (ref 10–24)
BUN: 16 mg/dL (ref 8–27)
CHLORIDE: 106 mmol/L (ref 96–106)
CO2: 18 mmol/L — ABNORMAL LOW (ref 20–29)
CREATININE: 1 mg/dL (ref 0.76–1.27)
Calcium: 9.3 mg/dL (ref 8.6–10.2)
GFR calc Af Amer: 93 mL/min/{1.73_m2} (ref >59)
GFR calc non Af Amer: 81 mL/min/{1.73_m2} (ref >59)
GLOBULIN, TOTAL: 2.7 g/dL (ref 1.5–4.5)
Glucose: 90 mg/dL (ref 65–99)
POTASSIUM: 4.4 mmol/L (ref 3.5–5.2)
SODIUM: 144 mmol/L (ref 134–144)
Total Protein: 7.1 g/dL (ref 6.0–8.5)

## 2017-03-31 LAB — CBC WITH DIFFERENTIAL/PLATELET
Basophils Absolute: 0 10*3/uL (ref 0.0–0.2)
Basos: 1 %
EOS (ABSOLUTE): 0.2 10*3/uL (ref 0.0–0.4)
EOS: 4 %
HEMATOCRIT: 45.9 % (ref 37.5–51.0)
Hemoglobin: 14.8 g/dL (ref 13.0–17.7)
Immature Grans (Abs): 0 10*3/uL (ref 0.0–0.1)
Immature Granulocytes: 1 %
LYMPHS ABS: 1.8 10*3/uL (ref 0.7–3.1)
Lymphs: 35 %
MCH: 29.4 pg (ref 26.6–33.0)
MCHC: 32.2 g/dL (ref 31.5–35.7)
MCV: 91 fL (ref 79–97)
MONOS ABS: 0.8 10*3/uL (ref 0.1–0.9)
Monocytes: 15 %
Neutrophils Absolute: 2.2 10*3/uL (ref 1.4–7.0)
Neutrophils: 44 %
PLATELETS: 217 10*3/uL (ref 150–379)
RBC: 5.03 x10E6/uL (ref 4.14–5.80)
RDW: 13.2 % (ref 12.3–15.4)
WBC: 5.1 10*3/uL (ref 3.4–10.8)

## 2017-03-31 NOTE — Telephone Encounter (Signed)
LVM on home phone informing patient that his blood work is relatively unremarkable. Left number for any questions.

## 2017-04-17 ENCOUNTER — Ambulatory Visit
Admission: RE | Admit: 2017-04-17 | Discharge: 2017-04-17 | Disposition: A | Discharge status: Home / Self Care | Payer: Managed Care, Other (non HMO) | Source: Ambulatory Visit | Attending: Adult Health | Admitting: Adult Health

## 2017-04-17 DIAGNOSIS — G35 Multiple sclerosis: Secondary | ICD-10-CM

## 2017-04-17 MED ORDER — GADOBENATE DIMEGLUMINE 529 MG/ML IV SOLN
20.0000 mL | Freq: Once | INTRAVENOUS | Status: AC | PRN
  Administered 2017-04-17: 20 mL via INTRAVENOUS

## 2017-04-21 ENCOUNTER — Telehealth: Payer: Self-pay | Admitting: *Deleted

## 2017-04-21 NOTE — Telephone Encounter (Signed)
LVM informing patient there were no acute findings on his MRI. We will continue to monitor.. Left number for any questions.

## 2018-02-23 ENCOUNTER — Other Ambulatory Visit: Payer: Self-pay | Admitting: Adult Health

## 2018-03-31 ENCOUNTER — Encounter: Payer: Self-pay | Admitting: Adult Health

## 2018-03-31 ENCOUNTER — Ambulatory Visit (INDEPENDENT_AMBULATORY_CARE_PROVIDER_SITE_OTHER): Payer: Managed Care, Other (non HMO) | Admitting: Adult Health

## 2018-03-31 VITALS — BP 125/70 | HR 56 | Ht 70.0 in | Wt 224.8 lb

## 2018-03-31 DIAGNOSIS — R569 Unspecified convulsions: Secondary | ICD-10-CM

## 2018-03-31 DIAGNOSIS — G35 Multiple sclerosis: Secondary | ICD-10-CM | POA: Diagnosis not present

## 2018-03-31 MED ORDER — LEVETIRACETAM 750 MG PO TABS: 750.0000 mg | ORAL_TABLET | Freq: Two times a day (BID) | ORAL | 3 refills | Status: DC

## 2018-03-31 MED ORDER — SERTRALINE HCL 100 MG PO TABS: 150.0000 mg | ORAL_TABLET | Freq: Every day | ORAL | 3 refills | Status: DC

## 2018-03-31 NOTE — Patient Instructions (Signed)
Your Plan:  Continue Keppra Continue Avonex Blood work today If your symptoms worsen or you develop new symptoms please let us know.   Thank you for coming to see [place] at [facility]. I hope we have been able to provide you high quality care today.  You may receive a patient satisfaction survey over the next few weeks. We would appreciate your feedback and comments so that we may continue to improve ourselves and the health of our patients.

## 2018-03-31 NOTE — Progress Notes (Signed)
I have read the note, and I agree with the clinical assessment and plan.  Troy Carney   

## 2018-03-31 NOTE — Progress Notes (Signed)
PATIENT: Troy Carney DOB: Feb 09, 1956  REASON FOR VISIT: follow up HISTORY FROM: patient  HISTORY OF PRESENT ILLNESS: Today 03/31/18:  Troy Carney is a 63 year old male with a history of seizures and multiple sclerosis.  He returns today for follow-up.  He denies any new numbness or tingling.  Denies any weakness in the extremities.  No change in his gait or balance.  No change of the bowels or bladder.  No changes to her vision.  He continues on Avonex.  He had an MRI in 2019 that did not show any new active lesions.  He continues on Keppra for seizures.  He denies any seizure events.  He operates a Teacher, music.  He returns today for evaluation.  HISTORY  03/30/17 Troy Carney is a 63 year old male with a history of seizures and multiple sclerosis.  He returns today for follow-up.  He remains on Keppra for seizure prevention.  He denies any seizure events.  He operates a Teacher, music without difficulty.  He is able to complete all ADLs independently.  He continues on Avonex for multiple sclerosis.  He denies any new numbness or weakness.  Denies any changes in his gait or balance.  Denies any changes with bowel or bladder.  No change in mood or behavior.  He remains on Zoloft 150 mg daily.  No change in his vision.  Overall he feels that he is doing well.  His last MRI of the brain was in 2016.  He returns today for evaluation.   REVIEW OF SYSTEMS: Out of a complete 14 system review of symptoms, the patient complains only of the following symptoms, and all other reviewed systems are negative.  See HPI  ALLERGIES: No Known Allergies  HOME MEDICATIONS: Outpatient Medications Prior to Visit  Medication Sig Dispense Refill  . aspirin 81 MG tablet Take 81 mg by mouth daily.     Elisha Ponder PREFILLED 30 MCG/0.5ML PSKT injection INJECT 0.5 MLS (30 MCG TOTAL) INTO THE MUSCLE EVERY 7 DAYS AS DIRECTED BY PHYSICIAN 3 each 3  . levETIRAcetam (KEPPRA) 750 MG tablet Take 1 tablet (750 mg total) by mouth  2 (two) times daily. 180 tablet 3  . sertraline (ZOLOFT) 100 MG tablet Take 1.5 tablets (150 mg total) by mouth daily. 150 tablet 3   No facility-administered medications prior to visit.     PAST MEDICAL HISTORY: Past Medical History:  Diagnosis Date  . Depression   . Multiple sclerosis ([place])   . Seizures ([place]) 03/28/2013   Dystonic    PAST SURGICAL HISTORY: Past Surgical History:  Procedure Laterality Date  . NASAL FRACTURE SURGERY      FAMILY HISTORY: Family History  Problem Relation Age of Onset  . COPD Mother   . Bladder Cancer Father   . Asthma Father   . Testicular cancer Brother   . Multiple sclerosis Son   . Multiple sclerosis Son     SOCIAL HISTORY: Social History   Socioeconomic History  . Marital status: Married    Spouse name: [name]  . Number of children: 2  . Years of education: 22  . Highest education level: Not on file  Occupational History  . Occupation: Glass blower/designer  . Financial resource strain: Not on file  . Food insecurity:    Worry: Not on file    Inability: Not on file  . Transportation needs:    Medical: Not on file    Non-medical: Not on file  Tobacco Use  .  Smoking status: Never Smoker  . Smokeless tobacco: Never Used  Substance and Sexual Activity  . Alcohol use: No  . Drug use: No  . Sexual activity: Not on file  Lifestyle  . Physical activity:    Days per week: Not on file    Minutes per session: Not on file  . Stress: Not on file  Relationships  . Social connections:    Talks on phone: Not on file    Gets together: Not on file    Attends religious service: Not on file    Active member of club or organization: Not on file    Attends meetings of clubs or organizations: Not on file    Relationship status: Not on file  . Intimate partner violence:    Fear of current or ex partner: Not on file    Emotionally abused: Not on file    Physically abused: Not on file    Forced sexual activity: Not on file  Other  Topics Concern  . Not on file  Social History Narrative   Patient is married [name]) and lives at home with his wife.   Patient has two children.   Patient is an Furniture conservator/restorer.   Patient is right-handed.   Patient drinks three caffeine drinks daily.      PHYSICAL EXAM  Vitals:   03/31/18 0713  BP: 125/70  Pulse: (!) 56  Weight: 224 lb 12.8 oz (102 kg)  Height: 5\' 10"  (1.778 m)   Body mass index is 32.26 kg/m.  Generalized: Well developed, in no acute distress   Neurological examination  Mentation: Alert oriented to time, place, history taking. Follows all commands speech and language fluent Cranial nerve II-XII: Pupils were equal round reactive to light. Extraocular movements were full, visual field were full on confrontational test. Facial sensation and strength were normal. Uvula tongue midline. Head turning and shoulder shrug  were normal and symmetric. Motor: The motor testing reveals 5 over 5 strength of all 4 extremities. Good symmetric motor tone is noted throughout.  Sensory: Sensory testing is intact to soft touch on all 4 extremities. No evidence of extinction is noted.  Coordination: Cerebellar testing reveals good finger-nose-finger and heel-to-shin bilaterally.  Gait and station: Gait is normal. Tandem gait is normal. Romberg is negative. No drift is seen.  Reflexes: Deep tendon reflexes are symmetric and normal bilaterally.   DIAGNOSTIC DATA (LABS, IMAGING, TESTING) - I reviewed patient records, labs, notes, testing and imaging myself where available.  Lab Results  Component Value Date   WBC 5.1 03/30/2017   HGB 14.8 03/30/2017   HCT 45.9 03/30/2017   MCV 91 03/30/2017   PLT 217 03/30/2017      Component Value Date/Time   NA 144 03/30/2017 0803   K 4.4 03/30/2017 0803   CL 106 03/30/2017 0803   CO2 18 (L) 03/30/2017 0803   GLUCOSE 90 03/30/2017 0803   BUN 16 03/30/2017 0803   CREATININE 1.00 03/30/2017 0803   CALCIUM 9.3 03/30/2017  0803   PROT 7.1 03/30/2017 0803   ALBUMIN 4.4 03/30/2017 0803   AST 29 03/30/2017 0803   ALT 34 03/30/2017 0803   ALKPHOS 94 03/30/2017 0803   BILITOT 0.3 03/30/2017 0803   GFRNONAA 81 03/30/2017 0803   GFRAA 93 03/30/2017 0803   No results found for: CHOL, HDL, LDLCALC, LDLDIRECT, TRIG, CHOLHDL No results found for: HGBA1C No results found for: VITAMINB12 No results found for: TSH    ASSESSMENT AND  PLAN 63 y.o. year old male  has a past medical history of Depression, Multiple sclerosis ([place]), and Seizures ([place]) (03/28/2013). here with:  1.  Multiple sclerosis 2.  Seizures  Overall the patient has done well.  He will continue on Keppra for seizure prevention.  He will continue on Avonex for multiple sclerosis.  I will check blood work today.  He is advised that if his symptoms worsen or he develops new symptoms he should let us know.  He will follow-up in 1 year or sooner if needed.    Ward Givens, MSN, NP-C 03/31/2018, 7:17 AM [facility] [address], [place], [place] [address] [phone]

## 2018-04-01 ENCOUNTER — Encounter: Payer: Self-pay | Admitting: *Deleted

## 2018-04-01 LAB — CBC WITH DIFFERENTIAL/PLATELET
Basophils Absolute: 0 10*3/uL (ref 0.0–0.2)
Basos: 1 %
EOS (ABSOLUTE): 0.3 10*3/uL (ref 0.0–0.4)
Eos: 5 %
HEMATOCRIT: 45 % (ref 37.5–51.0)
Hemoglobin: 14.6 g/dL (ref 13.0–17.7)
Immature Grans (Abs): 0 10*3/uL (ref 0.0–0.1)
Immature Granulocytes: 1 %
Lymphocytes Absolute: 2.1 10*3/uL (ref 0.7–3.1)
Lymphs: 34 %
MCH: 28.9 pg (ref 26.6–33.0)
MCHC: 32.4 g/dL (ref 31.5–35.7)
MCV: 89 fL (ref 79–97)
Monocytes Absolute: 0.6 10*3/uL (ref 0.1–0.9)
Monocytes: 10 %
Neutrophils Absolute: 3.1 10*3/uL (ref 1.4–7.0)
Neutrophils: 49 %
Platelets: 231 10*3/uL (ref 150–450)
RBC: 5.06 x10E6/uL (ref 4.14–5.80)
RDW: 12.2 % (ref 11.6–15.4)
WBC: 6.3 10*3/uL (ref 3.4–10.8)

## 2018-04-01 LAB — COMPREHENSIVE METABOLIC PANEL
ALT: 24 IU/L (ref 0–44)
AST: 20 IU/L (ref 0–40)
Albumin/Globulin Ratio: 1.7 (ref 1.2–2.2)
Albumin: 4 g/dL (ref 3.6–4.8)
Alkaline Phosphatase: 90 IU/L (ref 39–117)
BUN / CREAT RATIO: 12 (ref 10–24)
BUN: 12 mg/dL (ref 8–27)
Bilirubin Total: 0.4 mg/dL (ref 0.0–1.2)
CO2: 24 mmol/L (ref 20–29)
Calcium: 9.2 mg/dL (ref 8.6–10.2)
Chloride: 104 mmol/L (ref 96–106)
Creatinine, Ser: 1.01 mg/dL (ref 0.76–1.27)
GFR calc Af Amer: 92 mL/min/{1.73_m2} (ref >59)
GFR calc non Af Amer: 79 mL/min/{1.73_m2} (ref >59)
GLOBULIN, TOTAL: 2.4 g/dL (ref 1.5–4.5)
Glucose: 88 mg/dL (ref 65–99)
Potassium: 4.6 mmol/L (ref 3.5–5.2)
Sodium: 142 mmol/L (ref 134–144)
Total Protein: 6.4 g/dL (ref 6.0–8.5)

## 2018-04-29 ENCOUNTER — Other Ambulatory Visit: Payer: Self-pay | Admitting: Adult Health

## 2018-05-04 ENCOUNTER — Telehealth: Payer: Self-pay

## 2018-05-04 NOTE — Telephone Encounter (Signed)
Patient called stating that he was suppose to receive a 3 months supply of AVONEX PREFILLED 30 MCG/0.5ML PSKT injection at his last office visit. He would like it sent to [facility]. Please advise.

## 2018-05-05 MED ORDER — INTERFERON BETA-1A 30 MCG/0.5ML IM PSKT: PREFILLED_SYRINGE | INTRAMUSCULAR | 12 refills | Status: DC

## 2018-05-05 NOTE — Telephone Encounter (Signed)
Chart reviewed and refill appropriate. Refill submitted for a 90 day supply to [facility]. E-script confirmation received.

## 2018-05-05 NOTE — Addendum Note (Signed)
Addended by: Verlin Grills T on: 05/05/2018 09:25 AM   Modules accepted: Orders

## 2018-08-09 ENCOUNTER — Encounter: Payer: Self-pay | Admitting: *Deleted

## 2018-08-20 ENCOUNTER — Encounter: Payer: Self-pay | Admitting: Adult Health

## 2019-03-14 ENCOUNTER — Other Ambulatory Visit: Payer: Self-pay | Admitting: *Deleted

## 2019-03-14 MED ORDER — LEVETIRACETAM 750 MG PO TABS: 750.0000 mg | ORAL_TABLET | Freq: Two times a day (BID) | ORAL | 3 refills | Status: DC

## 2019-04-04 ENCOUNTER — Ambulatory Visit: Payer: Managed Care, Other (non HMO) | Admitting: Adult Health

## 2019-04-24 ENCOUNTER — Other Ambulatory Visit: Payer: Self-pay | Admitting: Adult Health

## 2019-06-14 ENCOUNTER — Encounter: Payer: Self-pay | Admitting: Adult Health

## 2019-06-14 ENCOUNTER — Ambulatory Visit (INDEPENDENT_AMBULATORY_CARE_PROVIDER_SITE_OTHER): Payer: Managed Care, Other (non HMO) | Admitting: Adult Health

## 2019-06-14 ENCOUNTER — Other Ambulatory Visit: Payer: Self-pay

## 2019-06-14 VITALS — BP 122/69 | HR 60 | Temp 97.6°F | Ht 70.0 in | Wt 235.0 lb

## 2019-06-14 DIAGNOSIS — R569 Unspecified convulsions: Secondary | ICD-10-CM | POA: Diagnosis not present

## 2019-06-14 DIAGNOSIS — G35 Multiple sclerosis: Secondary | ICD-10-CM

## 2019-06-14 MED ORDER — AVONEX PREFILLED 30 MCG/0.5ML IM PSKT: PREFILLED_SYRINGE | INTRAMUSCULAR | 12 refills | Status: DC

## 2019-06-14 NOTE — Progress Notes (Signed)
PATIENT: Troy Carney DOB: 1955-05-12  REASON FOR VISIT: follow up HISTORY FROM: patient  HISTORY OF PRESENT ILLNESS: Today 06/14/19:  [name] is a 64 year old male with a history of seizures and multiple sclerosis.  He returns today for follow-up.  He denies any seizure events.  Continues on Keppra.  He remains on Avonex and tolerating it well.  No change in his gait or balance.  No new numbness or weakness.  No changes with the bowels or bladder.  No vision changes.  Returns today for follow-up  HISTORY 03/31/18:  [name] is a 64 year old male with a history of seizures and multiple sclerosis.  He returns today for follow-up.  He denies any new numbness or tingling.  Denies any weakness in the extremities.  No change in his gait or balance.  No change of the bowels or bladder.  No changes to her vision.  He continues on Avonex.  He had an MRI in 2019 that did not show any new active lesions.  He continues on Keppra for seizures.  He denies any seizure events.  He operates a Teacher, music.  He returns today for evaluation.  REVIEW OF SYSTEMS: Out of a complete 14 system review of symptoms, the patient complains only of the following symptoms, and all other reviewed systems are negative.  See HPI  ALLERGIES: No Known Allergies  HOME MEDICATIONS: Outpatient Medications Prior to Visit  Medication Sig Dispense Refill  . aspirin 81 MG tablet Take 81 mg by mouth daily.     . interferon beta-1a (AVONEX PREFILLED) 30 MCG/0.5ML PSKT injection INJECT 0.5 MLS (30 MCG TOTAL) INTO THE MUSCLE EVERY 7 DAYS AS DIRECTED BY PHYSICIAN 3 each 12  . levETIRAcetam (KEPPRA) 750 MG tablet Take 1 tablet (750 mg total) by mouth 2 (two) times daily. 180 tablet 3  . sertraline (ZOLOFT) 100 MG tablet TAKE 1 AND 1/2 TABLETS(150 MG) BY MOUTH DAILY 135 tablet 3   No facility-administered medications prior to visit.    PAST MEDICAL HISTORY: Past Medical History:  Diagnosis Date  . Depression   . Multiple  sclerosis ([place])   . Seizures ([place]) 03/28/2013   Dystonic    PAST SURGICAL HISTORY: Past Surgical History:  Procedure Laterality Date  . NASAL FRACTURE SURGERY      FAMILY HISTORY: Family History  Problem Relation Age of Onset  . COPD Mother   . Bladder Cancer Father   . Asthma Father   . Testicular cancer Brother   . Multiple sclerosis Son   . Multiple sclerosis Son     SOCIAL HISTORY: Social History   Socioeconomic History  . Marital status: Married    Spouse name: [name]  . Number of children: 2  . Years of education: 32  . Highest education level: Not on file  Occupational History  . Occupation: Chief Financial Officer  Tobacco Use  . Smoking status: Never Smoker  . Smokeless tobacco: Never Used  Substance and Sexual Activity  . Alcohol use: No  . Drug use: No  . Sexual activity: Not on file  Other Topics Concern  . Not on file  Social History Narrative   Patient is married [name]) and lives at home with his wife.   Patient has two children.   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   Patient is right-handed.   Patient drinks three caffeine drinks daily.   Social Determinants of Health   Financial Resource Strain:   . Difficulty of Paying  Living Expenses:   Food Insecurity:   . Worried About Charity fundraiser in the Last Year:   . Arboriculturist in the Last Year:   Transportation Needs:   . Film/video editor (Medical):   Marland Kitchen Lack of Transportation (Non-Medical):   Physical Activity:   . Days of Exercise per Week:   . Minutes of Exercise per Session:   Stress:   . Feeling of Stress :   Social Connections:   . Frequency of Communication with Friends and Family:   . Frequency of Social Gatherings with Friends and Family:   . Attends Religious Services:   . Active Member of Clubs or Organizations:   . Attends Archivist Meetings:   Marland Kitchen Marital Status:   Intimate Partner Violence:   . Fear of Current or Ex-Partner:   .  Emotionally Abused:   Marland Kitchen Physically Abused:   . Sexually Abused:       PHYSICAL EXAM  Vitals:   06/14/19 1130  BP: 122/69  Pulse: 60  Temp: 97.6 F (36.4 C)  Weight: 235 lb (106.6 kg)  Height: 5\' 10"  (1.778 m)   Body mass index is 33.72 kg/m.  Generalized: Well developed, in no acute distress   Neurological examination  Mentation: Alert oriented to time, place, history taking. Follows all commands speech and language fluent Cranial nerve II-XII: Pupils were equal round reactive to light. Extraocular movements were full, visual field were full on confrontational test. Facial sensation and strength were normal. Uvula tongue midline. Head turning and shoulder shrug  were normal and symmetric. Motor: The motor testing reveals 5 over 5 strength of all 4 extremities. Good symmetric motor tone is noted throughout.  Sensory: Sensory testing is intact to soft touch on all 4 extremities. No evidence of extinction is noted.  Coordination: Cerebellar testing reveals good finger-nose-finger and heel-to-shin bilaterally.  Gait and station: Gait is normal.  Reflexes: Deep tendon reflexes are symmetric and normal bilaterally.   DIAGNOSTIC DATA (LABS, IMAGING, TESTING) - I reviewed patient records, labs, notes, testing and imaging myself where available.  Lab Results  Component Value Date   WBC 6.3 03/31/2018   HGB 14.6 03/31/2018   HCT 45.0 03/31/2018   MCV 89 03/31/2018   PLT 231 03/31/2018      Component Value Date/Time   NA 142 03/31/2018 0944   K 4.6 03/31/2018 0944   CL 104 03/31/2018 0944   CO2 24 03/31/2018 0944   GLUCOSE 88 03/31/2018 0944   BUN 12 03/31/2018 0944   CREATININE 1.01 03/31/2018 0944   CALCIUM 9.2 03/31/2018 0944   PROT 6.4 03/31/2018 0944   ALBUMIN 4.0 03/31/2018 0944   AST 20 03/31/2018 0944   ALT 24 03/31/2018 0944   ALKPHOS 90 03/31/2018 0944   BILITOT 0.4 03/31/2018 0944   GFRNONAA 79 03/31/2018 0944   GFRAA 92 03/31/2018 0944      ASSESSMENT  AND PLAN 64 y.o. year old male  has a past medical history of Depression, Multiple sclerosis ([place]), and Seizures ([place]) (03/28/2013). here with:  1.  Multiple sclerosis  -Continue Avonex -Blood work today -May consider repeating images at the next visit  2.  Seizures  -Continue Keppra  Advised if symptoms worsen or he develops any new symptoms he should let us know.  Follow-up in 1 year or sooner if needed   I spent 25 minutes of face-to-face and non-face-to-face time with patient.  This included previsit chart review, lab review, study  review, order entry, electronic health record documentation, patient education.  Ward Givens, MSN, NP-C 06/14/2019, 11:17 AM [facility] [address] [place], [place] [address] [phone]

## 2019-06-14 NOTE — Patient Instructions (Signed)
Your Plan:  Continue Avonex  Continue Keppra Blood work today If your symptoms worsen or you develop new symptoms please let us know.   Thank you for coming to see [place] at [facility]. I hope we have been able to provide you high quality care today.  You may receive a patient satisfaction survey over the next few weeks. We would appreciate your feedback and comments so that we may continue to improve ourselves and the health of our patients.

## 2019-06-15 ENCOUNTER — Telehealth: Payer: Self-pay | Admitting: *Deleted

## 2019-06-15 LAB — COMPREHENSIVE METABOLIC PANEL
ALT: 33 IU/L (ref 0–44)
AST: 27 IU/L (ref 0–40)
Albumin/Globulin Ratio: 2 (ref 1.2–2.2)
Albumin: 4.4 g/dL (ref 3.8–4.8)
Alkaline Phosphatase: 94 IU/L (ref 39–117)
BUN/Creatinine Ratio: 21 (ref 10–24)
BUN: 17 mg/dL (ref 8–27)
Bilirubin Total: 0.3 mg/dL (ref 0.0–1.2)
CO2: 24 mmol/L (ref 20–29)
Calcium: 9.3 mg/dL (ref 8.6–10.2)
Chloride: 104 mmol/L (ref 96–106)
Creatinine, Ser: 0.82 mg/dL (ref 0.76–1.27)
GFR calc Af Amer: 109 mL/min/{1.73_m2} (ref >59)
GFR calc non Af Amer: 94 mL/min/{1.73_m2} (ref >59)
Globulin, Total: 2.2 g/dL (ref 1.5–4.5)
Glucose: 85 mg/dL (ref 65–99)
Potassium: 4.5 mmol/L (ref 3.5–5.2)
Sodium: 141 mmol/L (ref 134–144)
Total Protein: 6.6 g/dL (ref 6.0–8.5)

## 2019-06-15 LAB — CBC WITH DIFFERENTIAL/PLATELET
Basophils Absolute: 0.1 10*3/uL (ref 0.0–0.2)
Basos: 1 %
EOS (ABSOLUTE): 0.3 10*3/uL (ref 0.0–0.4)
Eos: 4 %
Hematocrit: 43.6 % (ref 37.5–51.0)
Hemoglobin: 14.7 g/dL (ref 13.0–17.7)
Immature Grans (Abs): 0.1 10*3/uL (ref 0.0–0.1)
Immature Granulocytes: 1 %
Lymphocytes Absolute: 2 10*3/uL (ref 0.7–3.1)
Lymphs: 31 %
MCH: 30.8 pg (ref 26.6–33.0)
MCHC: 33.7 g/dL (ref 31.5–35.7)
MCV: 91 fL (ref 79–97)
Monocytes Absolute: 0.8 10*3/uL (ref 0.1–0.9)
Monocytes: 13 %
Neutrophils Absolute: 3.3 10*3/uL (ref 1.4–7.0)
Neutrophils: 50 %
Platelets: 221 10*3/uL (ref 150–450)
RBC: 4.78 x10E6/uL (ref 4.14–5.80)
RDW: 12.3 % (ref 11.6–15.4)
WBC: 6.5 10*3/uL (ref 3.4–10.8)

## 2019-06-15 NOTE — Telephone Encounter (Signed)
I spoke to the patient and he verbalized understanding of his results.

## 2019-06-15 NOTE — Telephone Encounter (Signed)
-----   Message from Ward Givens, NP sent at 06/15/2019  8:47 AM EDT ----- Labs results are unremarkable. Please call patient with results.

## 2019-06-16 NOTE — Progress Notes (Signed)
I have read the note, and I agree with the clinical assessment and plan.  Kameren Pargas K Innocence Schlotzhauer   

## 2019-07-10 ENCOUNTER — Other Ambulatory Visit: Payer: Self-pay | Admitting: Adult Health

## 2019-11-23 ENCOUNTER — Other Ambulatory Visit: Payer: Self-pay | Admitting: Urology

## 2019-11-23 ENCOUNTER — Other Ambulatory Visit (HOSPITAL_COMMUNITY): Payer: Self-pay | Admitting: Urology

## 2019-11-23 DIAGNOSIS — D49512 Neoplasm of unspecified behavior of left kidney: Secondary | ICD-10-CM

## 2019-12-01 ENCOUNTER — Encounter (HOSPITAL_COMMUNITY): Payer: Self-pay

## 2019-12-01 ENCOUNTER — Ambulatory Visit (HOSPITAL_COMMUNITY): Payer: Managed Care, Other (non HMO)

## 2020-02-28 ENCOUNTER — Other Ambulatory Visit: Payer: Self-pay | Admitting: Adult Health

## 2020-02-29 ENCOUNTER — Other Ambulatory Visit: Payer: Self-pay | Admitting: *Deleted

## 2020-02-29 MED ORDER — SERTRALINE HCL 100 MG PO TABS: ORAL_TABLET | ORAL | 3 refills | Status: DC

## 2020-06-13 ENCOUNTER — Ambulatory Visit (INDEPENDENT_AMBULATORY_CARE_PROVIDER_SITE_OTHER): Payer: Managed Care, Other (non HMO) | Admitting: Adult Health

## 2020-06-13 ENCOUNTER — Encounter: Payer: Self-pay | Admitting: Adult Health

## 2020-06-13 ENCOUNTER — Telehealth: Payer: Self-pay | Admitting: Adult Health

## 2020-06-13 VITALS — BP 139/74 | HR 58 | Ht 70.0 in | Wt 230.0 lb

## 2020-06-13 DIAGNOSIS — G35 Multiple sclerosis: Secondary | ICD-10-CM | POA: Diagnosis not present

## 2020-06-13 DIAGNOSIS — R569 Unspecified convulsions: Secondary | ICD-10-CM | POA: Diagnosis not present

## 2020-06-13 NOTE — Patient Instructions (Signed)
Your Plan:  Continue Avonex Continue Keppra  MRI brain ordered If your symptoms worsen or you develop new symptoms please let us know.    Thank you for coming to see [place] at [facility]. I hope we have been able to provide you high quality care today.  You may receive a patient satisfaction survey over the next few weeks. We would appreciate your feedback and comments so that we may continue to improve ourselves and the health of our patients.

## 2020-06-13 NOTE — Telephone Encounter (Signed)
[employer] order sent to GI. They will obtain the auth and reach out to the patient to schedule.  °

## 2020-06-13 NOTE — Progress Notes (Signed)
PATIENT: Troy Carney DOB: Sep 28, 1955  REASON FOR VISIT: follow up HISTORY FROM: patient  HISTORY OF PRESENT ILLNESS: Today 06/13/20:  [name] is a 65 year old male with a history of seizures and multiple sclerosis.  He returns today for follow-up.  Overall he feels that he is remained stable.  Denies any changes with his gait or balance.  Denies any new numbness or weakness.  No changes in his vision.  Reports that he is sleeping well.  Denies any seizure events.  Continues on Avonex and Keppra.  Last MRI of the brain was in 2019.  06/14/19: [name] is a 65 year old male with a history of seizures and multiple sclerosis.  He returns today for follow-up.  He denies any seizure events.  Continues on Keppra.  He remains on Avonex and tolerating it well.  No change in his gait or balance.  No new numbness or weakness.  No changes with the bowels or bladder.  No vision changes.  Returns today for follow-up  HISTORY 03/31/18:  [name] is a 65 year old male with a history of seizures and multiple sclerosis.  He returns today for follow-up.  He denies any new numbness or tingling.  Denies any weakness in the extremities.  No change in his gait or balance.  No change of the bowels or bladder.  No changes to her vision.  He continues on Avonex.  He had an MRI in 2019 that did not show any new active lesions.  He continues on Keppra for seizures.  He denies any seizure events.  He operates a Teacher, music.  He returns today for evaluation.  REVIEW OF SYSTEMS: Out of a complete 14 system review of symptoms, the patient complains only of the following symptoms, and all other reviewed systems are negative.  See HPI  ALLERGIES: No Known Allergies  HOME MEDICATIONS: Outpatient Medications Prior to Visit  Medication Sig Dispense Refill  . aspirin 81 MG tablet Take 81 mg by mouth daily.     . interferon beta-1a (AVONEX PREFILLED) 30 MCG/0.5ML PSKT injection INJECT 1 SYRINGE INTO THE MUSCLE EVERY 7  DAYS AS DIRECTED BY PHYSICIAN 3 each 7  . levETIRAcetam (KEPPRA) 750 MG tablet TAKE 1 TABLET(750 MG) BY MOUTH TWICE DAILY 180 tablet 3  . sertraline (ZOLOFT) 100 MG tablet TAKE 1 AND 1/2 TABLETS(150 MG) BY MOUTH DAILY 135 tablet 3  . tamsulosin (FLOMAX) 0.4 MG CAPS capsule Take 0.8 mg by mouth daily.    . meloxicam (MOBIC) 7.5 MG tablet Take 7.5 mg by mouth daily.     No facility-administered medications prior to visit.    PAST MEDICAL HISTORY: Past Medical History:  Diagnosis Date  . Depression   . Multiple sclerosis ([place])   . Seizures ([place]) 03/28/2013   Dystonic    PAST SURGICAL HISTORY: Past Surgical History:  Procedure Laterality Date  . NASAL FRACTURE SURGERY      FAMILY HISTORY: Family History  Problem Relation Age of Onset  . COPD Mother   . Bladder Cancer Father   . Asthma Father   . Testicular cancer Brother   . Multiple sclerosis Son   . Multiple sclerosis Son     SOCIAL HISTORY: Social History   Socioeconomic History  . Marital status: Married    Spouse name: Troy Carney  . Number of children: 2  . Years of education: 67  . Highest education level: Not on file  Occupational History  . Occupation: Chief Financial Officer  Tobacco Use  . Smoking status:  Never Smoker  . Smokeless tobacco: Never Used  Substance and Sexual Activity  . Alcohol use: No  . Drug use: No  . Sexual activity: Not on file  Other Topics Concern  . Not on file  Social History Narrative   Patient is married Troy Carney) and lives at home with his wife.   Patient has two children.   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   Patient is right-handed.   Patient drinks three caffeine drinks daily.   Social Determinants of Health   Financial Resource Strain: Not on file  Food Insecurity: Not on file  Transportation Needs: Not on file  Physical Activity: Not on file  Stress: Not on file  Social Connections: Not on file  Intimate Partner Violence: Not on file       PHYSICAL EXAM  Vitals:   06/13/20 1101  BP: 139/74  Pulse: (!) 58  Weight: 230 lb (104.3 kg)  Height: 5\' 10"  (1.778 m)   Body mass index is 33 kg/m.  Generalized: Well developed, in no acute distress   Neurological examination  Mentation: Alert oriented to time, place, history taking. Follows all commands speech and language fluent Cranial nerve II-XII: Pupils were equal round reactive to light. Extraocular movements were full, visual field were full on confrontational test. Facial sensation and strength were normal. Uvula tongue midline. Head turning and shoulder shrug  were normal and symmetric. Motor: The motor testing reveals 5 over 5 strength of all 4 extremities. Good symmetric motor tone is noted throughout.  Sensory: Sensory testing is intact to soft touch on all 4 extremities. No evidence of extinction is noted.  Coordination: Cerebellar testing reveals good finger-nose-finger and heel-to-shin bilaterally.  Gait and station: Gait is normal.  Reflexes: Deep tendon reflexes are symmetric and normal bilaterally.   DIAGNOSTIC DATA (LABS, IMAGING, TESTING) - I reviewed patient records, labs, notes, testing and imaging myself where available.  Lab Results  Component Value Date   WBC 6.5 06/14/2019   HGB 14.7 06/14/2019   HCT 43.6 06/14/2019   MCV 91 06/14/2019   PLT 221 06/14/2019      Component Value Date/Time   NA 141 06/14/2019 1219   K 4.5 06/14/2019 1219   CL 104 06/14/2019 1219   CO2 24 06/14/2019 1219   GLUCOSE 85 06/14/2019 1219   BUN 17 06/14/2019 1219   CREATININE 0.82 06/14/2019 1219   CALCIUM 9.3 06/14/2019 1219   PROT 6.6 06/14/2019 1219   ALBUMIN 4.4 06/14/2019 1219   AST 27 06/14/2019 1219   ALT 33 06/14/2019 1219   ALKPHOS 94 06/14/2019 1219   BILITOT 0.3 06/14/2019 1219   GFRNONAA 94 06/14/2019 1219   GFRAA 109 06/14/2019 1219      ASSESSMENT AND PLAN 65 y.o. year old male  has a past medical history of Depression, Multiple  sclerosis ([place]), and Seizures ([place]) (03/28/2013). here with:  1.  Multiple sclerosis  -Continue Avonex -Patient had blood work in January with PCP - will send [place] to our office -MRI of the brain with and without contrast -Pending results of MRI may will consult with MD about stopping Avonex  2.  Seizures  -Continue Keppra 750 mg twice a day  Advised if symptoms worsen or he develops any new symptoms he should let us know.  Follow-up in 1 year or sooner if needed   I spent 25 minutes of face-to-face and non-face-to-face time with patient.  This included previsit chart review, lab review,  study review, order entry, electronic health record documentation, patient education.  Ward Givens, MSN, NP-C 06/13/2020, 11:05 AM [facility] [address], [place], [place] [address] [phone]

## 2020-06-17 NOTE — Progress Notes (Signed)
I have read the note, and I agree with the clinical assessment and plan.  Octavie Westerhold K Kaidin Boehle   

## 2020-06-19 ENCOUNTER — Other Ambulatory Visit (HOSPITAL_COMMUNITY): Payer: Self-pay | Admitting: Urology

## 2020-06-19 ENCOUNTER — Other Ambulatory Visit: Payer: Self-pay | Admitting: Urology

## 2020-06-19 DIAGNOSIS — N281 Cyst of kidney, acquired: Secondary | ICD-10-CM

## 2020-06-27 ENCOUNTER — Ambulatory Visit
Admission: RE | Admit: 2020-06-27 | Discharge: 2020-06-27 | Disposition: A | Discharge status: Home / Self Care | Payer: Medicare HMO | Source: Ambulatory Visit | Attending: Adult Health | Admitting: Adult Health

## 2020-06-27 ENCOUNTER — Other Ambulatory Visit: Payer: Self-pay

## 2020-06-27 DIAGNOSIS — G35 Multiple sclerosis: Secondary | ICD-10-CM | POA: Diagnosis not present

## 2020-06-27 MED ORDER — GADOBENATE DIMEGLUMINE 529 MG/ML IV SOLN
20.0000 mL | Freq: Once | INTRAVENOUS | Status: AC | PRN
  Administered 2020-06-27: 20 mL via INTRAVENOUS

## 2020-07-02 ENCOUNTER — Telehealth: Payer: Self-pay | Admitting: Adult Health

## 2020-07-02 NOTE — Telephone Encounter (Signed)
Spoke to pt relayed that per MMNP and Dr Jannifer Franklin that he can (his decision) to stop avonex.  Will monitor yearly for next 3 yrs. And if develops sx to let us know.  Pt states he would like to do this. He just will stop taking.  (I relayed no taper is necessary).  He appreciated call back.

## 2020-07-02 NOTE — Telephone Encounter (Signed)
-----   Message from Kathrynn Ducking, MD sent at 07/02/2020  2:51 PM EDT ----- If the patient is motivated toward stopping the medication, okay to do so.  I would monitor MRI of the brain annually for the next 3 years if off all disease modifying agents. ----- Message ----- From: Ward Givens, NP Sent: 07/02/2020  10:46 AM EDT To: Kathrynn Ducking, MD  [name],   This patients has been stable for quite some time. On avonex. Asking about stopping it. Repeated MRI- no new lesions. Your thoughts?  Troy Carney

## 2020-07-02 NOTE — Telephone Encounter (Signed)
Sandy please advise the patient that I discussed stopping Avonex with Dr. Jannifer Franklin.  If the patient is motivated to stop the medication then Troy Carney can do that.  We will monitor his MRIs yearly for the next 3 years.  Of course if Troy Carney develops any symptoms consistent with multiple sclerosis Troy Carney should let us know.  Ultimately it is up to the patient if Troy Carney wants to come off of Avonex

## 2020-07-03 ENCOUNTER — Encounter (HOSPITAL_COMMUNITY): Payer: Self-pay

## 2020-07-03 ENCOUNTER — Ambulatory Visit (HOSPITAL_COMMUNITY): Payer: Medicare HMO

## 2020-07-07 ENCOUNTER — Other Ambulatory Visit: Payer: Self-pay

## 2020-07-07 ENCOUNTER — Ambulatory Visit (HOSPITAL_COMMUNITY)
Admission: RE | Admit: 2020-07-07 | Discharge: 2020-07-07 | Disposition: A | Discharge status: Home / Self Care | Payer: Medicare HMO | Source: Ambulatory Visit | Attending: Urology | Admitting: Urology

## 2020-07-07 DIAGNOSIS — N281 Cyst of kidney, acquired: Secondary | ICD-10-CM | POA: Insufficient documentation

## 2020-07-07 MED ORDER — GADOBUTROL 1 MMOL/ML IV SOLN
10.0000 mL | Freq: Once | INTRAVENOUS | Status: AC | PRN
  Administered 2020-07-07: 10 mL via INTRAVENOUS

## 2020-08-21 ENCOUNTER — Other Ambulatory Visit: Payer: Self-pay | Admitting: Adult Health

## 2020-08-22 DIAGNOSIS — R3914 Feeling of incomplete bladder emptying: Secondary | ICD-10-CM | POA: Diagnosis not present

## 2020-08-22 DIAGNOSIS — N130 Hydronephrosis with ureteropelvic junction obstruction: Secondary | ICD-10-CM | POA: Diagnosis not present

## 2020-09-03 ENCOUNTER — Other Ambulatory Visit: Payer: Self-pay | Admitting: Adult Health

## 2020-09-05 ENCOUNTER — Telehealth: Payer: Self-pay | Admitting: Adult Health

## 2020-09-05 ENCOUNTER — Other Ambulatory Visit: Payer: Self-pay

## 2020-09-05 MED ORDER — AVONEX PREFILLED 30 MCG/0.5ML IM PSKT: PREFILLED_SYRINGE | INTRAMUSCULAR | 7 refills | Status: DC

## 2020-09-05 NOTE — Telephone Encounter (Signed)
Denise from [facility] is requesting a prescription for the pt's interferon beta-1a (AVONEX PREFILLED) 30 MCG/0.5ML PSKT injection  Please fax or call in to the pharmacy

## 2020-09-05 NOTE — Telephone Encounter (Signed)
Rx sent to [facility]) [place]. Receipt conformed 09/05/2020  1:27 PM EDT)

## 2020-10-16 DIAGNOSIS — H5213 Myopia, bilateral: Secondary | ICD-10-CM | POA: Diagnosis not present

## 2020-10-16 DIAGNOSIS — G35 Multiple sclerosis: Secondary | ICD-10-CM | POA: Diagnosis not present

## 2020-10-16 DIAGNOSIS — H52223 Regular astigmatism, bilateral: Secondary | ICD-10-CM | POA: Diagnosis not present

## 2020-10-16 DIAGNOSIS — H524 Presbyopia: Secondary | ICD-10-CM | POA: Diagnosis not present

## 2020-10-16 DIAGNOSIS — H2513 Age-related nuclear cataract, bilateral: Secondary | ICD-10-CM | POA: Diagnosis not present

## 2020-10-17 DIAGNOSIS — H5213 Myopia, bilateral: Secondary | ICD-10-CM | POA: Diagnosis not present

## 2020-10-17 DIAGNOSIS — H2513 Age-related nuclear cataract, bilateral: Secondary | ICD-10-CM | POA: Diagnosis not present

## 2021-01-09 ENCOUNTER — Other Ambulatory Visit: Payer: Self-pay

## 2021-01-09 ENCOUNTER — Emergency Department (HOSPITAL_BASED_OUTPATIENT_CLINIC_OR_DEPARTMENT_OTHER): Payer: Medicare HMO

## 2021-01-09 ENCOUNTER — Emergency Department (HOSPITAL_BASED_OUTPATIENT_CLINIC_OR_DEPARTMENT_OTHER)
Admission: EM | Admit: 2021-01-09 | Discharge: 2021-01-09 | Disposition: A | Discharge status: Home / Self Care | Payer: Medicare HMO | Attending: Emergency Medicine | Admitting: Emergency Medicine

## 2021-01-09 ENCOUNTER — Encounter (HOSPITAL_BASED_OUTPATIENT_CLINIC_OR_DEPARTMENT_OTHER): Payer: Self-pay | Admitting: Emergency Medicine

## 2021-01-09 DIAGNOSIS — I8002 Phlebitis and thrombophlebitis of superficial vessels of left lower extremity: Secondary | ICD-10-CM | POA: Diagnosis not present

## 2021-01-09 DIAGNOSIS — M79652 Pain in left thigh: Secondary | ICD-10-CM | POA: Diagnosis not present

## 2021-01-09 DIAGNOSIS — M79605 Pain in left leg: Secondary | ICD-10-CM | POA: Diagnosis not present

## 2021-01-09 DIAGNOSIS — Z7982 Long term (current) use of aspirin: Secondary | ICD-10-CM | POA: Insufficient documentation

## 2021-01-09 DIAGNOSIS — Z23 Encounter for immunization: Secondary | ICD-10-CM | POA: Diagnosis not present

## 2021-01-09 DIAGNOSIS — I83812 Varicose veins of left lower extremities with pain: Secondary | ICD-10-CM | POA: Diagnosis not present

## 2021-01-09 NOTE — ED Triage Notes (Signed)
Reports knots to left thigh for two weeks.  Initially had pain.  Reports none now but sent by PCP to r/o blood clot.

## 2021-01-09 NOTE — ED Provider Notes (Signed)
[facility] EMERGENCY DEPT Provider Note   CSN: [id number] Arrival date & time: 01/09/21  1702     History Chief Complaint  Patient presents with   Leg Pain    Troy Carney is a 65 y.o. male.  Presents to ER with concern for leg pain.  Has been having intermittent pain/swelling to his left distal thigh for the past month or so.  A couple weeks ago had a long road trip to the apartments of [place] however the symptoms preceded this.  Patient went to his primary care office today and he was recommended he get ultrasound to rule out DVT.  Patient denies any chest pain or difficulty in breathing.  Otherwise has been feeling fine.  No prior history of blood clots.  No cancer history.  No family history of clotting disorders.  Non-smoker.  Never smoker.  Today he is having minimal pain but is still noted some swelling to his left thigh.  HPI     Past Medical History:  Diagnosis Date   Depression    Multiple sclerosis ([place])    Seizures ([place]) 03/28/2013   Dystonic    Patient Active Problem List   Diagnosis Date Noted   Lumbar strain 03/29/2015   Multiple sclerosis ([place]) 03/28/2013   Seizures ([place]) 03/28/2013    Past Surgical History:  Procedure Laterality Date   NASAL FRACTURE SURGERY         Family History  Problem Relation Age of Onset   COPD Mother    Bladder Cancer Father    Asthma Father    Testicular cancer Brother    Multiple sclerosis Son    Multiple sclerosis Son     Social History   Tobacco Use   Smoking status: Never   Smokeless tobacco: Never  Substance Use Topics   Alcohol use: No   Drug use: No    Home Medications Prior to Admission medications   Medication Sig Start Date End Date Taking? Authorizing Provider  aspirin 81 MG tablet Take 81 mg by mouth daily.     [provider]  interferon beta-1a (AVONEX PREFILLED) 30 MCG/0.5ML PSKT injection INJECT 1 SYRINGE INTO THE MUSCLE EVERY 7 DAYS AS DIRECTED BY PHYSICIAN 09/05/20    Ward Givens, NP  levETIRAcetam (KEPPRA) 750 MG tablet TAKE 1 TABLET(750 MG) BY MOUTH TWICE DAILY 02/28/20   Ward Givens, NP  sertraline (ZOLOFT) 100 MG tablet TAKE 1 AND 1/2 TABLETS(150 MG) BY MOUTH DAILY 02/29/20   Ward Givens, NP  tamsulosin (FLOMAX) 0.4 MG CAPS capsule Take 0.8 mg by mouth daily. 06/11/19   [provider]    Allergies    Patient has no known allergies.  Review of Systems   Review of Systems  Constitutional:  Negative for chills and fever.  HENT:  Negative for ear pain and sore throat.   Eyes:  Negative for pain and visual disturbance.  Respiratory:  Negative for cough and shortness of breath.   Cardiovascular:  Negative for chest pain and palpitations.  Gastrointestinal:  Negative for abdominal pain and vomiting.  Genitourinary:  Negative for dysuria and hematuria.  Musculoskeletal:  Positive for arthralgias. Negative for back pain.  Skin:  Negative for color change and rash.  Neurological:  Negative for seizures and syncope.  All other systems reviewed and are negative.  Physical Exam Updated Vital Signs BP 133/70 (BP Location: Right Arm)   Pulse 73   Temp 98 F (36.7 C) (Oral)   Resp 16   Ht 5'  10" (1.778 m)   Wt 104.3 kg   SpO2 96%   BMI 33.00 kg/m   Physical Exam Vitals and nursing note reviewed.  Constitutional:      Appearance: He is well-developed.  HENT:     Head: Normocephalic and atraumatic.  Eyes:     Conjunctiva/sclera: Conjunctivae normal.  Cardiovascular:     Rate and Rhythm: Normal rate and regular rhythm.     Heart sounds: No murmur heard. Pulmonary:     Effort: Pulmonary effort is normal. No respiratory distress.  Musculoskeletal:     Cervical back: Neck supple.     Comments: Left lower extremity: There is mild swelling and slight redness to the anteromedial thigh, DP and PT pulses are intact, sensation intact  Skin:    General: Skin is warm and dry.  Neurological:     General: No focal deficit present.      Mental Status: He is alert.  Psychiatric:        Mood and Affect: Mood normal.    ED Results / Procedures / Treatments   Labs (all labs ordered are listed, but only abnormal results are displayed) Labs Reviewed - No data to display  EKG None  Radiology US Venous Img Lower  Left (DVT Study)  Result Date: 01/09/2021 CLINICAL DATA:  Left lower extremity pain and swelling for 2 weeks EXAM: LEFT LOWER EXTREMITY VENOUS DOPPLER ULTRASOUND TECHNIQUE: Gray-scale sonography with graded compression, as well as color Doppler and duplex ultrasound were performed to evaluate the lower extremity deep venous systems from the level of the common femoral vein and including the common femoral, femoral, profunda femoral, popliteal and calf veins including the posterior tibial, peroneal and gastrocnemius veins when visible. The superficial great saphenous vein was also interrogated. Spectral Doppler was utilized to evaluate flow at rest and with distal augmentation maneuvers in the common femoral, femoral and popliteal veins. COMPARISON:  None. FINDINGS: Contralateral Common Femoral Vein: Respiratory phasicity is normal and symmetric with the symptomatic side. No evidence of thrombus. Normal compressibility. Common Femoral Vein: No evidence of thrombus. Normal compressibility, respiratory phasicity and response to augmentation. Saphenofemoral Junction: No evidence of thrombus. Normal compressibility and flow on color Doppler imaging. Profunda Femoral Vein: No evidence of thrombus. Normal compressibility and flow on color Doppler imaging. Femoral Vein: No evidence of thrombus. Normal compressibility, respiratory phasicity and response to augmentation. Popliteal Vein: No evidence of thrombus. Normal compressibility, respiratory phasicity and response to augmentation. Calf Veins: No evidence of thrombus. Normal compressibility and flow on color Doppler imaging. Superficial Great Saphenous Vein: Superficial  thrombophlebitis is noted within the lower thigh with tortuosity. Venous Reflux:  None. Other Findings:  None. IMPRESSION: No evidence of deep venous thrombosis. Superficial thrombophlebitis is noted within the greater saphenous vein in the more distal aspect of thigh. Electronically Signed   By: Inez Catalina M.D.   On: 01/09/2021 19:34    Procedures Procedures   Medications Ordered in ED Medications - No data to display  ED Course  I have reviewed the triage vital signs and the nursing notes.  Pertinent labs & imaging results that were available during my care of the patient were reviewed by me and considered in my medical decision making (see chart for details).    MDM Rules/Calculators/A&P                           65 year old gentleman presenting to ER with concern for 1 month of intermittent  left thigh pain/swelling.  On exam he does have mild superficial swelling and tenderness over the anterior medial distal thigh.  Positive for superficial thrombophlebitis at greater saphenous vein.  Lengthy discussion with patient and wife regarding treatment options including prophylactic dosing of anticoagulation versus NSAIDs.  Patient reports that he would prefer to be discharged and follow-up with his primary care doctor tomorrow morning to discuss treatment options.  States that his primary care doctor was planning to follow-up with him tomorrow regarding the results.  As patient is well-appearing in no distress, neurovascularly intact, believe he is safe to be discharged home and discussed treatment options of either anticoagulation, prophylactic anticoagulation dosing or NSAIDs with close interval follow-up for his superficial venous thrombosis.  No deep venous thrombosis.    After the discussed management above, the patient was determined to be safe for discharge.  The patient was in agreement with this plan and all questions regarding their care were answered.  ED return precautions were  discussed and the patient will return to the ED with any significant worsening of condition.  Final Clinical Impression(s) / ED Diagnoses Final diagnoses:  Thrombophlebitis of superficial veins of left lower extremity    Rx / DC Orders ED Discharge Orders     None        Lucrezia Starch, MD 01/10/21 0028

## 2021-01-09 NOTE — Discharge Instructions (Addendum)
The ultrasound showed a superficial thrombophlebitis in your distal greater saphenous vein.  Please discuss this finding with your primary care doctor first thing tomorrow morning and discuss management options.  You will either need to be on a regimen of NSAIDs such as Motrin or naproxen or a regular blood thinner.   If you develop any chest pain or difficulty in breathing, passing out or feeling lightheaded, significant increase in swelling or redness of your leg, come back to ER for reassessment.

## 2021-02-23 ENCOUNTER — Other Ambulatory Visit: Payer: Self-pay | Admitting: Adult Health

## 2021-03-12 ENCOUNTER — Ambulatory Visit: Payer: Medicare HMO | Admitting: Physician Assistant

## 2021-03-12 ENCOUNTER — Other Ambulatory Visit: Payer: Self-pay

## 2021-03-12 VITALS — BP 119/68 | HR 50 | Temp 98.4°F | Resp 20 | Ht 70.0 in | Wt 231.0 lb

## 2021-03-12 DIAGNOSIS — I809 Phlebitis and thrombophlebitis of unspecified site: Secondary | ICD-10-CM

## 2021-03-12 NOTE — Progress Notes (Signed)
[facility] [place]   Reason for referral: Left leg superficial thrombophlebitis   History of Present Illness  Troy Carney is a 65 y.o. male who presents with chief complaint: left medial thigh erythema and localized edema in early Oct. 2022.  He was seen in the ED and diagnosed with superficial thrombophlebitis given short term anticoagulation and NSAIDS.  His symptoms have resolved.  He has a history of asymptomatic varicose veins without edema, weeping or non healing venous statis wounds.  He has no skin changes.      Negative history of DVT, HTN, or CAD   Past Medical History:  Diagnosis Date   Depression    Multiple sclerosis ([place])    Phlebitis and thombophlb of superfic vessels of l low extrem    Seizures ([place]) 03/28/2013   Dystonic    Past Surgical History:  Procedure Laterality Date   NASAL FRACTURE SURGERY      Social History   Socioeconomic History   Marital status: Married    Spouse name: [name]   Number of children: 2   Years of education: 16   Highest education level: Not on file  Occupational History   Occupation: Chief Financial Officer  Tobacco Use   Smoking status: Never   Smokeless tobacco: Never  Vaping Use   Vaping Use: Never used  Substance and Sexual Activity   Alcohol use: No   Drug use: No   Sexual activity: Not on file  Other Topics Concern   Not on file  Social History Narrative   Patient is married [name]) and lives at home with his wife.   Patient has two children.   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   Patient is right-handed.   Patient drinks three caffeine drinks daily.   Social Determinants of Health   Financial Resource Strain: Not on file  Food Insecurity: Not on file  Transportation Needs: Not on file  Physical Activity: Not on file  Stress: Not on file  Social Connections: Not on file  Intimate Partner Violence: Not on file    Family History  Problem Relation Age of Onset   COPD  Mother    Bladder Cancer Father    Asthma Father    Testicular cancer Brother    Multiple sclerosis Son    Multiple sclerosis Son     Current Outpatient Medications on File Prior to Visit  Medication Sig Dispense Refill   levETIRAcetam (KEPPRA) 750 MG tablet TAKE 1 TABLET BY MOUTH TWICE A DAY 180 tablet 1   sertraline (ZOLOFT) 100 MG tablet TAKE 1 AND 1/2 TABLETS(150 MG) BY MOUTH DAILY 135 tablet 3   tamsulosin (FLOMAX) 0.4 MG CAPS capsule Take 0.8 mg by mouth daily.     interferon beta-1a (AVONEX PREFILLED) 30 MCG/0.5ML PSKT injection INJECT 1 SYRINGE INTO THE MUSCLE EVERY 7 DAYS AS DIRECTED BY PHYSICIAN (Patient not taking: Reported on 03/12/2021) 3 each 7   No current facility-administered medications on file prior to visit.    Allergies as of 03/12/2021   (No Known Allergies)     ROS:   General:  No weight loss, Fever, chills  HEENT: No recent headaches, no nasal bleeding, no visual changes, no sore throat  Neurologic: No dizziness, blackouts, seizures. No recent symptoms of stroke or mini- stroke. No recent episodes of slurred speech, or temporary blindness.  Cardiac: No recent episodes of chest pain/pressure, no shortness of breath at rest.  No shortness of breath with exertion.  Denies history of atrial fibrillation or irregular heartbeat  Vascular: No history of rest pain in feet.  No history of claudication.  No history of non-healing ulcer, No history of DVT   Pulmonary: No home oxygen, no productive cough, no hemoptysis,  No asthma or wheezing  Musculoskeletal:  [ ]  Arthritis, [ ]  Low back pain,  [ ]  Joint pain  Hematologic:No history of hypercoagulable state.  No history of easy bleeding.  No history of anemia  Gastrointestinal: No hematochezia or melena,  No gastroesophageal reflux, no trouble swallowing  Urinary: [ ]  chronic Kidney disease, [ ]  on HD - [ ]  MWF or [ ]  TTHS, [ ]  Burning with urination, [ ]  Frequent urination, [ ]  Difficulty urinating;   Skin:  No rashes  Psychological: No history of anxiety,  positive history of depression  Physical Examination  Vitals:   03/12/21 0958  BP: 119/68  Pulse: (!) 50  Resp: 20  Temp: 98.4 F (36.9 C)  SpO2: 97%  Weight: 231 lb (104.8 kg)  Height: 5\' 10"  (1.778 m)    Body mass index is 33.15 kg/m.  General:  Alert and oriented, no acute distress HEENT: Normal Neck: No bruit or JVD Pulmonary: Clear to auscultation bilaterally Cardiac: Regular Rate and Rhythm without murmur Abdomen: Soft, non-tender, non-distended, no mass, no scars Skin: No rash Extremity Pulses:  2+ radial, brachial, femoral, dorsalis pedis, posterior tibial pulses bilaterally Musculoskeletal: No deformity or edema  Neurologic: Upper and lower extremity motor 5/5 and symmetric  DATA: & Impression  CLINICAL DATA:  Left lower extremity pain and swelling for 2 weeks   EXAM: LEFT LOWER EXTREMITY VENOUS DOPPLER ULTRASOUND   TECHNIQUE: Gray-scale sonography with graded compression, as well as color Doppler and duplex ultrasound were performed to evaluate the lower extremity deep venous systems from the level of the common femoral vein and including the common femoral, femoral, profunda femoral, popliteal and calf veins including the posterior tibial, peroneal and gastrocnemius veins when visible. The superficial great saphenous vein was also interrogated. Spectral Doppler was utilized to evaluate flow at rest and with distal augmentation maneuvers in the common femoral, femoral and popliteal veins.   COMPARISON:  None.   FINDINGS: Contralateral Common Femoral Vein: Respiratory phasicity is normal and symmetric with the symptomatic side. No evidence of thrombus. Normal compressibility.   Common Femoral Vein: No evidence of thrombus. Normal compressibility, respiratory phasicity and response to augmentation.   Saphenofemoral Junction: No evidence of thrombus. Normal compressibility and flow on color Doppler  imaging.   Profunda Femoral Vein: No evidence of thrombus. Normal compressibility and flow on color Doppler imaging.   Femoral Vein: No evidence of thrombus. Normal compressibility, respiratory phasicity and response to augmentation.   Popliteal Vein: No evidence of thrombus. Normal compressibility, respiratory phasicity and response to augmentation.   Calf Veins: No evidence of thrombus. Normal compressibility and flow on color Doppler imaging.   Superficial Great Saphenous Vein: Superficial thrombophlebitis is noted within the lower thigh with tortuosity.   Venous Reflux:  None.   Other Findings:  None.   IMPRESSION: No evidence of deep venous thrombosis.   Superficial thrombophlebitis is noted within the greater saphenous vein in the more distal aspect of thigh.    Assessment/Plan: Resolved Superficial thrombophlebitis Varicose veins without evidence of chronic reflux.  He denise edema, claudication, or non healing venous wounds.  He has palpable pedal pulses and is not at risk of limb loss.  If he develops reviewed symptoms of venous reflux  such as edema or venous ulcers he will call.  F/U PRN.    Roxy Horseman PA-C [facility] [place] Office: [phone]  MD on call [place]

## 2021-03-28 DIAGNOSIS — J019 Acute sinusitis, unspecified: Secondary | ICD-10-CM | POA: Diagnosis not present

## 2021-04-02 DIAGNOSIS — U071 COVID-19: Secondary | ICD-10-CM | POA: Diagnosis not present

## 2021-04-09 ENCOUNTER — Other Ambulatory Visit: Payer: Self-pay | Admitting: Adult Health

## 2021-04-19 DIAGNOSIS — Z125 Encounter for screening for malignant neoplasm of prostate: Secondary | ICD-10-CM | POA: Diagnosis not present

## 2021-04-19 DIAGNOSIS — Z13220 Encounter for screening for lipoid disorders: Secondary | ICD-10-CM | POA: Diagnosis not present

## 2021-04-19 DIAGNOSIS — Z23 Encounter for immunization: Secondary | ICD-10-CM | POA: Diagnosis not present

## 2021-04-19 DIAGNOSIS — Z79899 Other long term (current) drug therapy: Secondary | ICD-10-CM | POA: Diagnosis not present

## 2021-04-19 DIAGNOSIS — Z136 Encounter for screening for cardiovascular disorders: Secondary | ICD-10-CM | POA: Diagnosis not present

## 2021-04-19 DIAGNOSIS — Z0000 Encounter for general adult medical examination without abnormal findings: Secondary | ICD-10-CM | POA: Diagnosis not present

## 2021-04-19 DIAGNOSIS — Z1211 Encounter for screening for malignant neoplasm of colon: Secondary | ICD-10-CM | POA: Diagnosis not present

## 2021-04-19 DIAGNOSIS — Z131 Encounter for screening for diabetes mellitus: Secondary | ICD-10-CM | POA: Diagnosis not present

## 2021-05-28 DIAGNOSIS — M5441 Lumbago with sciatica, right side: Secondary | ICD-10-CM | POA: Diagnosis not present

## 2021-05-28 DIAGNOSIS — G35 Multiple sclerosis: Secondary | ICD-10-CM | POA: Diagnosis not present

## 2021-05-28 DIAGNOSIS — M5442 Lumbago with sciatica, left side: Secondary | ICD-10-CM | POA: Diagnosis not present

## 2021-06-18 ENCOUNTER — Encounter: Payer: Self-pay | Admitting: Adult Health

## 2021-06-18 ENCOUNTER — Ambulatory Visit: Payer: Medicare HMO | Admitting: Adult Health

## 2021-06-18 ENCOUNTER — Telehealth: Payer: Self-pay | Admitting: Adult Health

## 2021-06-18 VITALS — BP 119/69 | HR 69 | Ht 70.0 in | Wt 231.8 lb

## 2021-06-18 DIAGNOSIS — G35 Multiple sclerosis: Secondary | ICD-10-CM | POA: Diagnosis not present

## 2021-06-18 DIAGNOSIS — R569 Unspecified convulsions: Secondary | ICD-10-CM | POA: Diagnosis not present

## 2021-06-18 MED ORDER — LEVETIRACETAM 750 MG PO TABS: 750.0000 mg | ORAL_TABLET | Freq: Two times a day (BID) | ORAL | 3 refills | Status: DC

## 2021-06-18 NOTE — Telephone Encounter (Signed)
[employer] medicare order sent to [facility], they will obtain the auth and reach out to the patient to schedule.  

## 2021-06-18 NOTE — Progress Notes (Signed)
? ? ?PATIENT: Troy Carney ?DOB: 13-Sep-1955 ? ?REASON FOR VISIT: follow up ?HISTORY FROM: patient ? ?Chief complaint from RN/CMA: Follow-up (Rm 4, alone.  MS/Seizures,  Off Avonex for MS off after MRI done last yr. Has had some back pain, taken Robaxin as needed.) ? ? ? ?HISTORY OF PRESENT ILLNESS: ?Today 06/18/21: ? ?Troy Carney is a 66 year old male history of seizures and multiple sclerosis.  He returns today for follow-up. ? ?Seizures: Denies any seizure events.  Remains on Keppra 750 mg twice a day.  Operates a motor vehicle without difficulty. Lives at home with his wife- completed all ADLs independently. ? ?MS: Off Avonex since MRI last year. Reports that he had one morning that he had trouble with his balance that lasted 1 hour then went away. No changes with bowel and bladder. No other changes with gait or balance. No changes with vision. Reports that he had back pain about 1 month ago- woke up like that- had trouble bending over. Saw his PCP they gave his a muscle relaxer and anti-inflammatory thought it was consistent with Sciatica and told her to FU with [place] if needed (per patient). ? ?06/13/20: Troy Carney is a 66 year old male with a history of seizures and multiple sclerosis.  He returns today for follow-up.  Overall he feels that he is remained stable.  Denies any changes with his gait or balance.  Denies any new numbness or weakness.  No changes in his vision.  Reports that he is sleeping well.  Denies any seizure events.  Continues on Avonex and Keppra.  Last MRI of the brain was in 2019. ? ?06/14/19: Troy Carney is a 66 year old male with a history of seizures and multiple sclerosis.  He returns today for follow-up.  He denies any seizure events.  Continues on Keppra.  He remains on Avonex and tolerating it well.  No change in his gait or balance.  No new numbness or weakness.  No changes with the bowels or bladder.  No vision changes.  Returns today for follow-up ? ?HISTORY 03/31/18: ?  ?Troy Carney is a  66 year old male with a history of seizures and multiple sclerosis.  He returns today for follow-up.  He denies any new numbness or tingling.  Denies any weakness in the extremities.  No change in his gait or balance.  No change of the bowels or bladder.  No changes to her vision.  He continues on Avonex.  He had an MRI in 2019 that did not show any new active lesions.  He continues on Keppra for seizures.  He denies any seizure events.  He operates a Teacher, music.  He returns today for evaluation. ? ?REVIEW OF SYSTEMS: Out of a complete 14 system review of symptoms, the patient complains only of the following symptoms, and all other reviewed systems are negative. ? ?See HPI ? ?ALLERGIES: ?No Known Allergies ? ?HOME MEDICATIONS: ?Outpatient Medications Prior to Visit  ?Medication Sig Dispense Refill  ? aspirin EC 81 MG tablet Take 81 mg by mouth daily. Swallow whole.    ? levETIRAcetam (KEPPRA) 750 MG tablet TAKE 1 TABLET BY MOUTH TWICE A DAY 180 tablet 1  ? methocarbamol (ROBAXIN) 750 MG tablet Take 750 mg by mouth every 6 (six) hours as needed.    ? sertraline (ZOLOFT) 100 MG tablet TAKE 1.5 TABLETS DAILY 135 tablet 0  ? tamsulosin (FLOMAX) 0.4 MG CAPS capsule Take 0.8 mg by mouth daily.    ? interferon beta-1a (AVONEX PREFILLED)  30 MCG/0.5ML PSKT injection INJECT 1 SYRINGE INTO THE MUSCLE EVERY 7 DAYS AS DIRECTED BY PHYSICIAN (Patient not taking: Reported on 03/12/2021) 3 each 7  ? ?No facility-administered medications prior to visit.  ? ? ?PAST MEDICAL HISTORY: ?Past Medical History:  ?Diagnosis Date  ? Depression   ? Multiple sclerosis ([place])   ? Phlebitis and thombophlb of superfic vessels of l low extrem   ? Seizures ([place]) 03/28/2013  ? Dystonic  ? ? ?PAST SURGICAL HISTORY: ?Past Surgical History:  ?Procedure Laterality Date  ? NASAL FRACTURE SURGERY    ? ? ?FAMILY HISTORY: ?Family History  ?Problem Relation Age of Onset  ? COPD Mother   ? Bladder Cancer Father   ? Asthma Father   ? Testicular cancer Brother    ? Multiple sclerosis Son   ? Multiple sclerosis Son   ? ? ?SOCIAL HISTORY: ?Social History  ? ?Socioeconomic History  ? Marital status: Married  ?  Spouse name: Troy Carney  ? Number of children: 2  ? Years of education: 26  ? Highest education level: Not on file  ?Occupational History  ? Occupation: Chief Financial Officer  ?Tobacco Use  ? Smoking status: Never  ? Smokeless tobacco: Never  ?Vaping Use  ? Vaping Use: Never used  ?Substance and Sexual Activity  ? Alcohol use: No  ? Drug use: No  ? Sexual activity: Not on file  ?Other Topics Concern  ? Not on file  ?Social History Narrative  ? Patient is married Troy Carney) and lives at home with his wife.  ? Patient has two children.  ? 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  ? Patient is right-handed.  ? Patient drinks three caffeine drinks daily.  ? ?Social Determinants of Health  ? ?Financial Resource Strain: Not on file  ?Food Insecurity: Not on file  ?Transportation Needs: Not on file  ?Physical Activity: Not on file  ?Stress: Not on file  ?Social Connections: Not on file  ?Intimate Partner Violence: Not on file  ? ? ? ? ?PHYSICAL EXAM ? ?Vitals:  ? 06/18/21 1057  ?BP: 119/69  ?Pulse: 69  ?Weight: 231 lb 12.8 oz (105.1 kg)  ?Height: '5\' 10"'$  (1.778 m)  ? ?Body mass index is 33.26 kg/m?. ? ?Generalized: Well developed, in no acute distress  ? ?Neurological examination  ?Mentation: Alert oriented to time, place, history taking. Follows all commands speech and language fluent ?Cranial nerve II-XII: Pupils were equal round reactive to light. Extraocular movements were full, visual field were full on confrontational test. Facial sensation and strength were normal. Head turning and shoulder shrug  were normal and symmetric. ?Motor: The motor testing reveals 5 over 5 strength of all 4 extremities. Good symmetric motor tone is noted throughout.  ?Sensory: Sensory testing is intact to soft touch on all 4 extremities. No evidence of extinction is noted.  ?Coordination:  Cerebellar testing reveals good finger-nose-finger and heel-to-shin bilaterally.  ?Gait and station: Gait is normal. Tandem gait slightly unsteady. Romberg is negative. ? ? ?DIAGNOSTIC DATA (LABS, IMAGING, TESTING) ?- I reviewed patient records, labs, notes, testing and imaging myself where available. ? ?Lab Results  ?Component Value Date  ? WBC 6.5 06/14/2019  ? HGB 14.7 06/14/2019  ? HCT 43.6 06/14/2019  ? MCV 91 06/14/2019  ? PLT 221 06/14/2019  ? ?   ?Component Value Date/Time  ? NA 141 06/14/2019 1219  ? K 4.5 06/14/2019 1219  ? CL 104 06/14/2019 1219  ? CO2 24 06/14/2019 1219  ?  GLUCOSE 85 06/14/2019 1219  ? BUN 17 06/14/2019 1219  ? CREATININE 0.82 06/14/2019 1219  ? CALCIUM 9.3 06/14/2019 1219  ? PROT 6.6 06/14/2019 1219  ? ALBUMIN 4.4 06/14/2019 1219  ? AST 27 06/14/2019 1219  ? ALT 33 06/14/2019 1219  ? ALKPHOS 94 06/14/2019 1219  ? BILITOT 0.3 06/14/2019 1219  ? GFRNONAA 94 06/14/2019 1219  ? GFRAA 109 06/14/2019 1219  ? ? ? ? ?ASSESSMENT AND PLAN ?66 y.o. year old male  has a past medical history of Depression, Multiple sclerosis ([place]), Phlebitis and thombophlb of superfic vessels of l low extrem, and Seizures ([place]) (03/28/2013). here with: ? ?1.  Multiple sclerosis ? ?- Stable ?-MRI of the brain with and without contrast ? ? ?2.  Seizures ? ?-Continue Keppra 750 mg twice a day ? ?3. Back pain with sciatica ? ?-Saw his PCP who gave him a muscle relaxer and anti-inflammatory reports that since then he has not had any additional pain.  Advised that if he begins having back pain he can certainly see his PCP initially but we can see him for further work-up if needed. ? ?Advised if symptoms worsen or he develops any new symptoms he should let us know.  Follow-up in 1 year or sooner if needed ? ? ? ? ?Ward Givens, MSN, NP-C 06/18/2021, 11:08 AM ?[facility] ?[address], [address] ?[place], [place] [address] ?[phone] ? ? ?

## 2021-06-18 NOTE — Patient Instructions (Addendum)
Your Plan: ? ?Continue Keppra ?MRI brain ordered ?If your symptoms worsen or you develop new symptoms please let us know.  ? ? ?Thank you for coming to see [place] at [facility]. I hope we have been able to provide you high quality care today. ? ?You may receive a patient satisfaction survey over the next few weeks. We would appreciate your feedback and comments so that we may continue to improve ourselves and the health of our patients. ? ?

## 2021-06-24 ENCOUNTER — Other Ambulatory Visit: Payer: Medicare HMO

## 2021-06-26 ENCOUNTER — Ambulatory Visit
Admission: RE | Admit: 2021-06-26 | Discharge: 2021-06-26 | Disposition: A | Discharge status: Home / Self Care | Payer: Medicare HMO | Source: Ambulatory Visit | Attending: Adult Health | Admitting: Adult Health

## 2021-06-26 DIAGNOSIS — G35 Multiple sclerosis: Secondary | ICD-10-CM

## 2021-06-26 MED ORDER — GADOBENATE DIMEGLUMINE 529 MG/ML IV SOLN
20.0000 mL | Freq: Once | INTRAVENOUS | Status: AC | PRN
  Administered 2021-06-26: 20 mL via INTRAVENOUS

## 2021-07-01 ENCOUNTER — Encounter: Payer: Self-pay | Admitting: *Deleted

## 2021-07-05 ENCOUNTER — Other Ambulatory Visit: Payer: Self-pay | Admitting: Adult Health

## 2021-08-29 DIAGNOSIS — N130 Hydronephrosis with ureteropelvic junction obstruction: Secondary | ICD-10-CM | POA: Diagnosis not present

## 2021-08-29 DIAGNOSIS — R3914 Feeling of incomplete bladder emptying: Secondary | ICD-10-CM | POA: Diagnosis not present

## 2021-09-05 DIAGNOSIS — Z09 Encounter for follow-up examination after completed treatment for conditions other than malignant neoplasm: Secondary | ICD-10-CM | POA: Diagnosis not present

## 2021-09-05 DIAGNOSIS — Z86010 Personal history of colonic polyps: Secondary | ICD-10-CM | POA: Diagnosis not present

## 2021-09-05 DIAGNOSIS — D123 Benign neoplasm of transverse colon: Secondary | ICD-10-CM | POA: Diagnosis not present

## 2021-09-05 DIAGNOSIS — K5730 Diverticulosis of large intestine without perforation or abscess without bleeding: Secondary | ICD-10-CM | POA: Diagnosis not present

## 2021-09-10 DIAGNOSIS — D123 Benign neoplasm of transverse colon: Secondary | ICD-10-CM | POA: Diagnosis not present

## 2021-10-09 ENCOUNTER — Other Ambulatory Visit: Payer: Self-pay | Admitting: Adult Health

## 2021-10-18 DIAGNOSIS — H5213 Myopia, bilateral: Secondary | ICD-10-CM | POA: Diagnosis not present

## 2021-12-04 DIAGNOSIS — M79672 Pain in left foot: Secondary | ICD-10-CM | POA: Diagnosis not present

## 2021-12-04 DIAGNOSIS — M722 Plantar fascial fibromatosis: Secondary | ICD-10-CM | POA: Diagnosis not present

## 2022-04-18 ENCOUNTER — Emergency Department (HOSPITAL_COMMUNITY): Payer: Medicare HMO

## 2022-04-18 ENCOUNTER — Emergency Department (HOSPITAL_COMMUNITY)
Admission: EM | Admit: 2022-04-18 | Discharge: 2022-04-18 | Disposition: A | Discharge status: Home / Self Care | Payer: Medicare HMO | Attending: Emergency Medicine | Admitting: Emergency Medicine

## 2022-04-18 ENCOUNTER — Other Ambulatory Visit: Payer: Self-pay

## 2022-04-18 ENCOUNTER — Encounter (HOSPITAL_COMMUNITY): Payer: Self-pay

## 2022-04-18 DIAGNOSIS — K5730 Diverticulosis of large intestine without perforation or abscess without bleeding: Secondary | ICD-10-CM | POA: Diagnosis not present

## 2022-04-18 DIAGNOSIS — K5792 Diverticulitis of intestine, part unspecified, without perforation or abscess without bleeding: Secondary | ICD-10-CM | POA: Diagnosis not present

## 2022-04-18 DIAGNOSIS — R109 Unspecified abdominal pain: Secondary | ICD-10-CM | POA: Diagnosis present

## 2022-04-18 DIAGNOSIS — Z7982 Long term (current) use of aspirin: Secondary | ICD-10-CM | POA: Insufficient documentation

## 2022-04-18 DIAGNOSIS — K5732 Diverticulitis of large intestine without perforation or abscess without bleeding: Secondary | ICD-10-CM | POA: Insufficient documentation

## 2022-04-18 DIAGNOSIS — N281 Cyst of kidney, acquired: Secondary | ICD-10-CM | POA: Diagnosis not present

## 2022-04-18 DIAGNOSIS — R1032 Left lower quadrant pain: Secondary | ICD-10-CM | POA: Diagnosis not present

## 2022-04-18 LAB — CBC
HCT: 40.4 % (ref 39.0–52.0)
Hemoglobin: 13.3 g/dL (ref 13.0–17.0)
MCH: 30.2 pg (ref 26.0–34.0)
MCHC: 32.9 g/dL (ref 30.0–36.0)
MCV: 91.6 fL (ref 80.0–100.0)
Platelets: 228 10*3/uL (ref 150–400)
RBC: 4.41 MIL/uL (ref 4.22–5.81)
RDW: 13.2 % (ref 11.5–15.5)
WBC: 8.2 10*3/uL (ref 4.0–10.5)
nRBC: 0 % (ref 0.0–0.2)

## 2022-04-18 LAB — COMPREHENSIVE METABOLIC PANEL
ALT: 20 U/L (ref 0–44)
AST: 21 U/L (ref 15–41)
Albumin: 3.6 g/dL (ref 3.5–5.0)
Alkaline Phosphatase: 78 U/L (ref 38–126)
Anion gap: 10 (ref 5–15)
BUN: 14 mg/dL (ref 8–23)
CO2: 24 mmol/L (ref 22–32)
Calcium: 8.5 mg/dL — ABNORMAL LOW (ref 8.9–10.3)
Chloride: 102 mmol/L (ref 98–111)
Creatinine, Ser: 0.86 mg/dL (ref 0.61–1.24)
GFR, Estimated: >60 mL/min (ref >60)
Glucose, Bld: 115 mg/dL — ABNORMAL HIGH (ref 70–99)
Potassium: 3.7 mmol/L (ref 3.5–5.1)
Sodium: 136 mmol/L (ref 135–145)
Total Bilirubin: 0.6 mg/dL (ref 0.3–1.2)
Total Protein: 6.5 g/dL (ref 6.5–8.1)

## 2022-04-18 LAB — LIPASE, BLOOD: Lipase: 37 U/L (ref 11–51)

## 2022-04-18 MED ORDER — AMOXICILLIN-POT CLAVULANATE 875-125 MG PO TABS: 1.0000 | ORAL_TABLET | Freq: Two times a day (BID) | ORAL | 0 refills | Status: DC

## 2022-04-18 MED ORDER — IOHEXOL 300 MG/ML  SOLN
100.0000 mL | Freq: Once | INTRAMUSCULAR | Status: AC | PRN
  Administered 2022-04-18: 100 mL via INTRAVENOUS

## 2022-04-18 MED ORDER — AMOXICILLIN-POT CLAVULANATE 875-125 MG PO TABS
1.0000 | ORAL_TABLET | Freq: Once | ORAL | Status: AC
  Administered 2022-04-18: 1 via ORAL
  Filled 2022-04-18: qty 1

## 2022-04-18 NOTE — ED Provider Triage Note (Signed)
Emergency Medicine Provider Triage Evaluation Note  Troy Carney , a 67 y.o. male  was evaluated in triage.  Pt complains of left lower quadrant abdominal pain since yesterday at 11:40 AM.  Patient states that pain is waxed and waned, will significantly decrease based upon position is laying in.  Patient states that he has no history of diverticulitis, no history of kidney stones.  Patient denies any fevers, nausea, vomiting, diarrhea, blood in stool.  Patient reports last bowel movement was this morning and was grossly normal.  Patient denies any dysuria, flank pain.  Patient denies shortness of breath or chest pain.  Patient was seen at PCP this morning and redirected to ED for CT scan.  Review of Systems  Positive:  Negative:   Physical Exam  BP (!) 143/58 (BP Location: Left Arm)   Pulse (!) 52   Temp 97.8 F (36.6 C) (Oral)   SpO2 95%  Gen:   Awake, no distress   Resp:  Normal effort  MSK:   Moves extremities without difficulty  Other:  Left-sided abdominal pain lower field  Medical Decision Making  Medically screening exam initiated at 1:29 PM.  Appropriate orders placed.  Troy Carney was informed that the remainder of the evaluation will be completed by another provider, this initial triage assessment does not replace that evaluation, and the importance of remaining in the ED until their evaluation is complete.     Azucena Cecil, PA-C 04/18/22 1330

## 2022-04-18 NOTE — Discharge Instructions (Addendum)
Thank you for coming to [facility] Emergency Department. You were seen for abdominal pain. We did an exam, labs, and imaging, and these showed diverticulitis. You can rest your bowel for a few days, by consuming a liquid diet or small easy to digest meals. You can also take augmentin twice per day for 7 days.   You can alternate taking Tylenol and ibuprofen as needed for pain. You can take '650mg'$  tylenol (acetaminophen) every 4-6 hours, and 600 mg ibuprofen 3 times a day.  Please follow up with your primary care provider within 1 week.   Do not hesitate to return to the ED or call 911 if you experience: -Worsening symptoms -Severe nausea/vomiting -Lightheadedness, passing out -Fevers/chills -Anything else that concerns you

## 2022-04-18 NOTE — ED Provider Notes (Signed)
[facility] Provider Note   CSN: [id number] Arrival date & time: 04/18/22  1311     History {Add pertinent medical, surgical, social history, OB history to HPI:1} Chief Complaint  Patient presents with   Abdominal Pain    Troy Carney is a 67 y.o. male with *** presents with abd pain.   Yesterday morning started having LLQ abd pain, made it difficult to sleep last night. Worse with movement. Rated 6-7/10 at its worst. Denies nausea/vomiting, diarrhea/constipation, melena/hematochezia, urinary symptoms. No abd surgeries. Last colonoscopy was last year, found small polyps, nothing major.    Abdominal Pain      Home Medications Prior to Admission medications   Medication Sig Start Date End Date Taking? Authorizing Provider  aspirin EC 81 MG tablet Take 81 mg by mouth daily. Swallow whole.    [provider]  levETIRAcetam (KEPPRA) 750 MG tablet Take 1 tablet (750 mg total) by mouth 2 (two) times daily. 06/18/21   Ward Givens, NP  methocarbamol (ROBAXIN) 750 MG tablet Take 750 mg by mouth every 6 (six) hours as needed. 05/28/21   [provider]  sertraline (ZOLOFT) 100 MG tablet TAKE 1 AND 1/2 TABLETS BY MOUTH EVERY DAY 10/09/21   Ward Givens, NP  tamsulosin (FLOMAX) 0.4 MG CAPS capsule Take 0.8 mg by mouth daily. 06/11/19   [provider]      Allergies    Patient has no known allergies.    Review of Systems   Review of Systems  Gastrointestinal:  Positive for abdominal pain.   Review of systems Negative for f/c.  A 10 point review of systems was performed and is negative unless otherwise reported in HPI.  Physical Exam Updated Vital Signs BP (!) 140/69   Pulse 60   Temp 97.8 F (36.6 C) (Oral)   Resp 18   Ht '5\' 10"'$  (1.778 m)   Wt 105 kg   SpO2 99%   BMI 33.21 kg/m  Physical Exam General: Normal appearing male, lying in bed.  HEENT: PERRLA, Sclera anicteric, MMM, trachea midline.   Cardiology: RRR, no murmurs/rubs/gallops. BL radial and DP pulses equal bilaterally.  Resp: Normal respiratory rate and effort. CTAB, no wheezes, rhonchi, crackles.  Abd: Soft, non-tender, non-distended. No rebound tenderness or guarding.  GU: Deferred. MSK: No peripheral edema or signs of trauma. Extremities without deformity or TTP. No cyanosis or clubbing. Skin: warm, dry. No rashes or lesions. Back: No CVA tenderness Neuro: A&Ox4, CNs II-XII grossly intact. MAEs. Sensation grossly intact.  Psych: Normal mood and affect.   ED Results / Procedures / Treatments   Labs (all labs ordered are listed, but only abnormal results are displayed) Labs Reviewed  COMPREHENSIVE METABOLIC PANEL - Abnormal; Notable for the following components:      Result Value   Glucose, Bld 115 (*)    Calcium 8.5 (*)    All other components within normal limits  CBC  LIPASE, BLOOD    EKG None  Radiology CT ABDOMEN PELVIS W CONTRAST  Result Date: 04/18/2022 CLINICAL DATA:  Acute left lower quadrant abdominal pain. EXAM: CT ABDOMEN AND PELVIS WITH CONTRAST TECHNIQUE: Multidetector CT imaging of the abdomen and pelvis was performed using the standard protocol following bolus administration of intravenous contrast. RADIATION DOSE REDUCTION: This exam was performed according to the departmental dose-optimization program which includes automated exposure control, adjustment of the mA and/or kV according to patient size and/or use of iterative reconstruction technique. CONTRAST:  173m  OMNIPAQUE IOHEXOL 300 MG/ML  SOLN COMPARISON:  October 13, 2019. FINDINGS: Lower chest: No acute abnormality. Hepatobiliary: No focal liver abnormality is seen. No gallstones, gallbladder wall thickening, or biliary dilatation. Pancreas: Unremarkable. No pancreatic ductal dilatation or surrounding inflammatory changes. Spleen: Normal in size without focal abnormality. Adrenals/Urinary Tract: Adrenal glands are unremarkable. No  hydronephrosis or renal obstruction is noted. Small cyst is seen in lower pole of left kidney for which no further follow-up is required. Bladder is unremarkable. Stomach/Bowel: The stomach appears normal. The appendix appears normal. Focal diverticulitis is seen involving the proximal sigmoid colon. No abscess formation is noted. There is no evidence of bowel obstruction. Vascular/Lymphatic: Aortic atherosclerosis. No enlarged abdominal or pelvic lymph nodes. Reproductive: Prostate is unremarkable. Other: No abdominal wall hernia or abnormality. No abdominopelvic ascites. Musculoskeletal: No acute or significant osseous findings. IMPRESSION: Focal diverticulitis is seen involving the proximal sigmoid colon. No definite abscess formation is noted. Aortic Atherosclerosis (ICD10-I70.0). Electronically Signed   By: Marijo Conception M.D.   On: 04/18/2022 19:49    Procedures Procedures  {Document cardiac monitor, telemetry assessment procedure when appropriate:1}  Medications Ordered in ED Medications  iohexol (OMNIPAQUE) 300 MG/ML solution 100 mL (100 mLs Intravenous Contrast Given 04/18/22 1917)    ED Course/ Medical Decision Making/ A&P                          Medical Decision Making Amount and/or Complexity of Data Reviewed Labs:  Decision-making details documented in ED Course. Radiology:  Decision-making details documented in ED Course.    This patient presents to the ED for concern of abdominal pain, this involves an extensive number of treatment options, and is a complaint that carries with it a high risk of complications and morbidity.  I considered the following differential and admission for this acute, potentially life threatening condition.   MDM:    For DDX for abdominal pain includes but is not limited to:  Abdominal exam without peritoneal signs. No evidence of acute abdomen at this time.   Low suspicion for acute hepatobiliary disease (including acute cholecystitis or  cholangitis), acute pancreatitis (neg lipase), PUD (including gastric perforation), acute infectious processes (pneumonia, hepatitis, pyelonephritis), acute appendicitis, vascular catastrophe, bowel obstruction, viscus perforation, or testicular/ovarian*** torsion, diverticulitis.    Clinical Course as of 04/18/22 2129  Fri Apr 18, 2022  2105 CT ABDOMEN PELVIS W CONTRAST IMPRESSION: Focal diverticulitis is seen involving the proximal sigmoid colon. No definite abscess formation is noted.  Aortic Atherosclerosis (ICD10-I70.0).   [HN]  2105 Lipase: 37 wnl [HN]  2105 WBC: 8.2 No leukocytosis or anemia [HN]  2105 Comprehensive metabolic panel(!) Unremarkable [HN]    Clinical Course User Index [HN] Audley Hose, MD    Labs: I Ordered, and personally interpreted labs.  The pertinent results include:  those listed above  Imaging Studies ordered: Imaging studies including CT abdomen pelvis ordered from triage I independently visualized and interpreted imaging. I agree with the radiologist interpretation  Additional history obtained from wife at bedside, chart review.   Reevaluation: After the interventions noted above, I reevaluated the patient and found that they have :improved  Social Determinants of Health: Patient lives independently   Disposition:  ***  Co morbidities that complicate the patient evaluation  Past Medical History:  Diagnosis Date   Depression    Multiple sclerosis ([place])    Phlebitis and thombophlb of superfic vessels of l low extrem    Seizures ([place])  03/28/2013   Dystonic     Medicines Meds ordered this encounter  Medications   iohexol (OMNIPAQUE) 300 MG/ML solution 100 mL    I have reviewed the patients home medicines and have made adjustments as needed  Problem List / ED Course: Problem List Items Addressed This Visit   None Visit Diagnoses     Sigmoid diverticulitis    -  Primary            {Document critical care time when  appropriate:1} {Document review of labs and clinical decision tools ie heart score, Chads2Vasc2 etc:1}  {Document your independent review of radiology images, and any outside records:1} {Document your discussion with family members, caretakers, and with consultants:1} {Document social determinants of health affecting pt's care:1} {Document your decision making why or why not admission, treatments were needed:1}  This note was created using dictation software, which may contain spelling or grammatical errors.

## 2022-04-18 NOTE — ED Triage Notes (Signed)
Pt presents with c/o abdominal pain that started yesterday. Pt reports pain is in the lower left side of his abdomen. Pt went to his PCP and was sent here for scans. Pt denies N/V/D.

## 2022-05-15 DIAGNOSIS — I700 Atherosclerosis of aorta: Secondary | ICD-10-CM | POA: Diagnosis not present

## 2022-05-15 DIAGNOSIS — R69 Illness, unspecified: Secondary | ICD-10-CM | POA: Diagnosis not present

## 2022-05-15 DIAGNOSIS — Z0000 Encounter for general adult medical examination without abnormal findings: Secondary | ICD-10-CM | POA: Diagnosis not present

## 2022-05-15 DIAGNOSIS — G35 Multiple sclerosis: Secondary | ICD-10-CM | POA: Diagnosis not present

## 2022-05-15 DIAGNOSIS — G40909 Epilepsy, unspecified, not intractable, without status epilepticus: Secondary | ICD-10-CM | POA: Diagnosis not present

## 2022-05-15 DIAGNOSIS — E7800 Pure hypercholesterolemia, unspecified: Secondary | ICD-10-CM | POA: Diagnosis not present

## 2022-05-15 DIAGNOSIS — Z79899 Other long term (current) drug therapy: Secondary | ICD-10-CM | POA: Diagnosis not present

## 2022-05-15 DIAGNOSIS — E669 Obesity, unspecified: Secondary | ICD-10-CM | POA: Diagnosis not present

## 2022-05-15 DIAGNOSIS — Z125 Encounter for screening for malignant neoplasm of prostate: Secondary | ICD-10-CM | POA: Diagnosis not present

## 2022-06-25 ENCOUNTER — Ambulatory Visit: Payer: Medicare HMO | Admitting: Adult Health

## 2022-07-03 ENCOUNTER — Encounter: Payer: Self-pay | Admitting: Adult Health

## 2022-07-03 ENCOUNTER — Ambulatory Visit: Payer: Medicare HMO | Admitting: Adult Health

## 2022-07-03 VITALS — BP 111/50 | HR 63 | Ht 70.0 in | Wt 235.4 lb

## 2022-07-03 DIAGNOSIS — G35 Multiple sclerosis: Secondary | ICD-10-CM

## 2022-07-03 DIAGNOSIS — R569 Unspecified convulsions: Secondary | ICD-10-CM | POA: Diagnosis not present

## 2022-07-03 MED ORDER — LEVETIRACETAM 750 MG PO TABS: 750.0000 mg | ORAL_TABLET | Freq: Two times a day (BID) | ORAL | 3 refills | Status: DC

## 2022-07-03 NOTE — Progress Notes (Signed)
PATIENT: Troy Carney DOB: April 24, 1955  REASON FOR VISIT: follow up HISTORY FROM: patient  Chief Complaint  Patient presents with   Follow-up    Pt in 4 Pt here for MS/Seizure f/u Pt states no questions or concerns for today's visit      HISTORY OF PRESENT ILLNESS: Today 07/03/22:  Troy Carney is a 67 y.o. male with a history of Seizures and MS. Returns today for follow-up.  Reports he has not had any seizure events.  Remains on Keppra 750 mg twice a day.  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  In regards to multiple sclerosis his scan last year was stable.  He has not been on any disease modifying therapy in the last 2 years.  Denies any new symptoms.  Returns today for an evaluation.    06/18/21: [name] is a 67 year old male history of seizures and multiple sclerosis.  He returns today for follow-up.  Seizures: Denies any seizure events.  Remains on Keppra 750 mg twice a day.  Operates a motor vehicle without difficulty. Lives at home with his wife- completed all ADLs independently.  MS: Off Avonex since MRI last year. Reports that he had one morning that he had trouble with his balance that lasted 1 hour then went away. No changes with bowel and bladder. No other changes with gait or balance. No changes with vision. Reports that he had back pain about 1 month ago- woke up like that- had trouble bending over. Saw his PCP they gave his a muscle relaxer and anti-inflammatory thought it was consistent with Sciatica and told her to FU with us if needed (per patient).  06/13/20: [name] is a [age] year old male with a history of seizures and multiple sclerosis.  He returns today for follow-up.  Overall he feels that he is remained stable.  Denies any changes with his gait or balance.  Denies any new numbness or weakness.  No changes in his vision.  Reports that he is sleeping well.  Denies any seizure events.  Continues on Avonex and Keppra.  Last MRI of the brain was in  2019.  06/14/19: [name] is a 67 year old male with a history of seizures and multiple sclerosis.  He returns today for follow-up.  He denies any seizure events.  Continues on Keppra.  He remains on Avonex and tolerating it well.  No change in his gait or balance.  No new numbness or weakness.  No changes with the bowels or bladder.  No vision changes.  Returns today for follow-up  HISTORY 03/31/18:   [name] is a [age] year old male with a history of seizures and multiple sclerosis.  He returns today for follow-up.  He denies any new numbness or tingling.  Denies any weakness in the extremities.  No change in his gait or balance.  No change of the bowels or bladder.  No changes to her vision.  He continues on Avonex.  He had an MRI in 2019 that did not show any new active lesions.  He continues on Keppra for seizures.  He denies any seizure events.  He operates a Librarian, academicmotor vehicle.  He returns today for evaluation.  REVIEW OF SYSTEMS: Out of a complete 14 system review of symptoms, the patient complains only of the following symptoms, and all other reviewed systems are negative.  See HPI  ALLERGIES: No Known Allergies  HOME MEDICATIONS: Outpatient Medications Prior to Visit  Medication Sig Dispense Refill   aspirin  EC 81 MG tablet Take 81 mg by mouth daily. Swallow whole.     levETIRAcetam (KEPPRA) 750 MG tablet Take 1 tablet (750 mg total) by mouth 2 (two) times daily. 180 tablet 3   methocarbamol (ROBAXIN) 750 MG tablet Take 750 mg by mouth every 6 (six) hours as needed.     sertraline (ZOLOFT) 100 MG tablet TAKE 1 AND 1/2 TABLETS BY MOUTH EVERY DAY 135 tablet 3   tamsulosin (FLOMAX) 0.4 MG CAPS capsule Take 0.8 mg by mouth daily.     amoxicillin-clavulanate (AUGMENTIN) 875-125 MG tablet Take 1 tablet by mouth every 12 (twelve) hours. 14 tablet 0   No facility-administered medications prior to visit.    PAST MEDICAL HISTORY: Past Medical History:  Diagnosis Date   Depression    Multiple  sclerosis    Phlebitis and thombophlb of superfic vessels of l low extrem    Seizures 03/28/2013   Dystonic    PAST SURGICAL HISTORY: Past Surgical History:  Procedure Laterality Date   NASAL FRACTURE SURGERY      FAMILY HISTORY: Family History  Problem Relation Age of Onset   COPD Mother    Bladder Cancer Father    Asthma Father    Testicular cancer Brother    Multiple sclerosis Son    Multiple sclerosis Son     SOCIAL HISTORY: Social History   Socioeconomic History   Marital status: Married    Spouse name: Clydie Braun   Number of children: 2   Years of education: 16   Highest education level: Not on file  Occupational History   Occupation: Art gallery manager  Tobacco Use   Smoking status: Never   Smokeless tobacco: Never  Vaping Use   Vaping Use: Never used  Substance and Sexual Activity   Alcohol use: No   Drug use: No   Sexual activity: Not on file  Other Topics Concern   Not on file  Social History Narrative   Patient is married Clydie Braun) and lives at home with his wife.   Patient has two children.   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   Patient is right-handed.   Patient drinks three caffeine drinks daily.   Social Determinants of Health   Financial Resource Strain: Not on file  Food Insecurity: Not on file  Transportation Needs: Not on file  Physical Activity: Not on file  Stress: Not on file  Social Connections: Not on file  Intimate Partner Violence: Not on file      PHYSICAL EXAM  Vitals:   07/03/22 1107  BP: (!) 111/50  Pulse: 63  Weight: 235 lb 6.4 oz (106.8 kg)  Height: 5\' 10"  (1.778 m)   Body mass index is 33.78 kg/m.  Generalized: Well developed, in no acute distress   Neurological examination  Mentation: Alert oriented to time, place, history taking. Follows all commands speech and language fluent Cranial nerve II-XII: Pupils were equal round reactive to light. Extraocular movements were full, visual field  were full on confrontational test. Facial sensation and strength were normal. Head turning and shoulder shrug  were normal and symmetric. Motor: The motor testing reveals 5 over 5 strength of all 4 extremities. Good symmetric motor tone is noted throughout.  Sensory: Sensory testing is intact to soft touch on all 4 extremities. No evidence of extinction is noted.  Coordination: Cerebellar testing reveals good finger-nose-finger and heel-to-shin bilaterally.  Gait and station: Gait is normal  DIAGNOSTIC DATA (LABS, IMAGING, TESTING) - I reviewed  patient records, labs, notes, testing and imaging myself where available.  Lab Results  Component Value Date   WBC 8.2 04/18/2022   HGB 13.3 04/18/2022   HCT 40.4 04/18/2022   MCV 91.6 04/18/2022   PLT 228 04/18/2022      Component Value Date/Time   NA 136 04/18/2022 1339   NA 141 06/14/2019 1219   K 3.7 04/18/2022 1339   CL 102 04/18/2022 1339   CO2 24 04/18/2022 1339   GLUCOSE 115 (H) 04/18/2022 1339   BUN 14 04/18/2022 1339   BUN 17 06/14/2019 1219   CREATININE 0.86 04/18/2022 1339   CALCIUM 8.5 (L) 04/18/2022 1339   PROT 6.5 04/18/2022 1339   PROT 6.6 06/14/2019 1219   ALBUMIN 3.6 04/18/2022 1339   ALBUMIN 4.4 06/14/2019 1219   AST 21 04/18/2022 1339   ALT 20 04/18/2022 1339   ALKPHOS 78 04/18/2022 1339   BILITOT 0.6 04/18/2022 1339   BILITOT 0.3 06/14/2019 1219   GFRNONAA >60 04/18/2022 1339   GFRAA 109 06/14/2019 1219      ASSESSMENT AND PLAN 67 y.o. year old male  has a past medical history of Depression, Multiple sclerosis, Phlebitis and thombophlb of superfic vessels of l low extrem, and Seizures (03/28/2013). here with:  1.  Multiple sclerosis  - Stable -Continue to monitor   2.  Seizures  -Continue Keppra 750 mg twice a day    Advised if symptoms worsen or he develops any new symptoms he should let us know.  Follow-up in 1 year or sooner if needed     Butch Penny, MSN, NP-C 07/03/2022, 11:13  AM [facility] [address] [place], [place] [address] [phone]

## 2022-07-25 DIAGNOSIS — E7800 Pure hypercholesterolemia, unspecified: Secondary | ICD-10-CM | POA: Diagnosis not present

## 2022-09-17 DIAGNOSIS — N130 Hydronephrosis with ureteropelvic junction obstruction: Secondary | ICD-10-CM | POA: Diagnosis not present

## 2022-09-17 DIAGNOSIS — R3914 Feeling of incomplete bladder emptying: Secondary | ICD-10-CM | POA: Diagnosis not present

## 2022-09-17 DIAGNOSIS — R3912 Poor urinary stream: Secondary | ICD-10-CM | POA: Diagnosis not present

## 2022-10-09 ENCOUNTER — Other Ambulatory Visit: Payer: Self-pay | Admitting: Adult Health

## 2022-10-27 DIAGNOSIS — H5213 Myopia, bilateral: Secondary | ICD-10-CM | POA: Diagnosis not present

## 2022-10-27 DIAGNOSIS — Z0100 Encounter for examination of eyes and vision without abnormal findings: Secondary | ICD-10-CM | POA: Diagnosis not present

## 2022-12-16 IMAGING — US US EXTREM LOW VENOUS*L*
1 series · 13 of 24 positions shown · non-contrast
Comparison: None.

CLINICAL DATA: Left lower extremity pain and swelling for 2 weeks



[Series 1: us venous img lower uni left (dvt) · portal-venous · 13 of 42 slices shown]
[im 1/42]
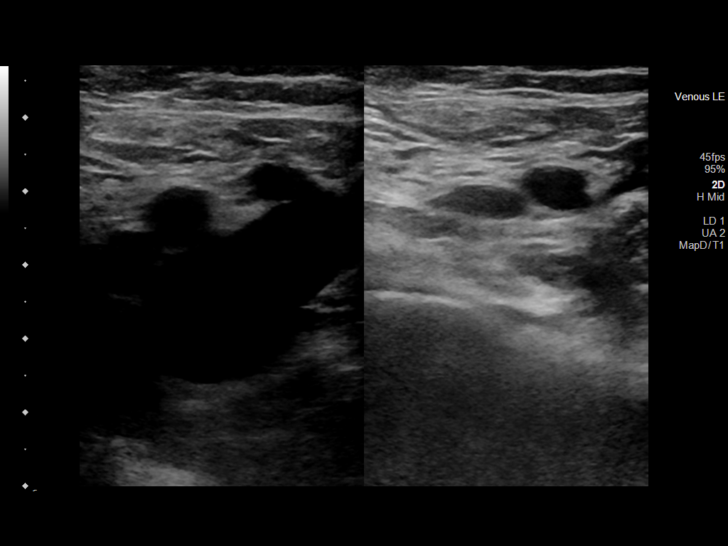
[im 4/42]
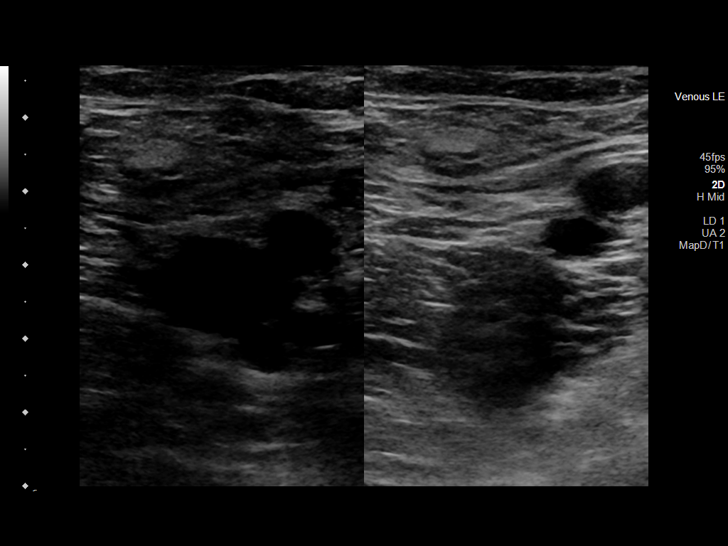
[im 8/42]
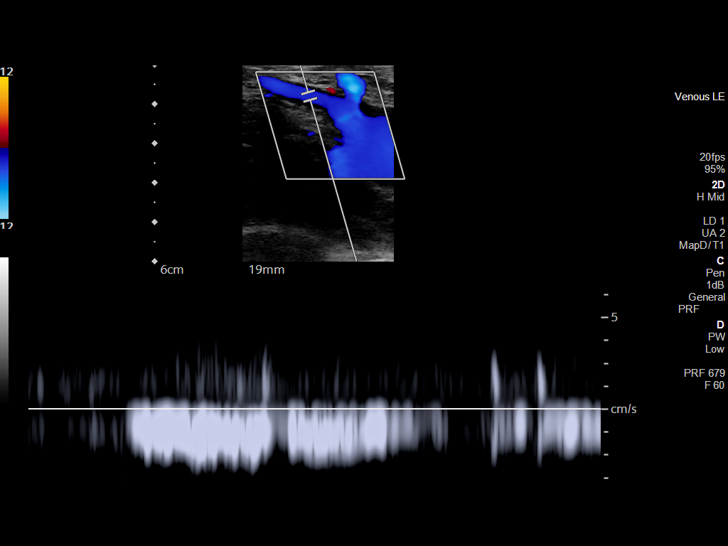
[im 11/42]
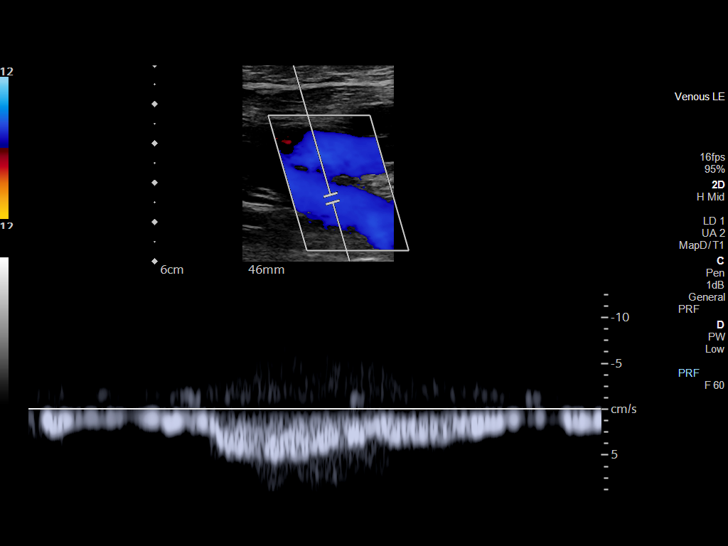
[im 15/42]
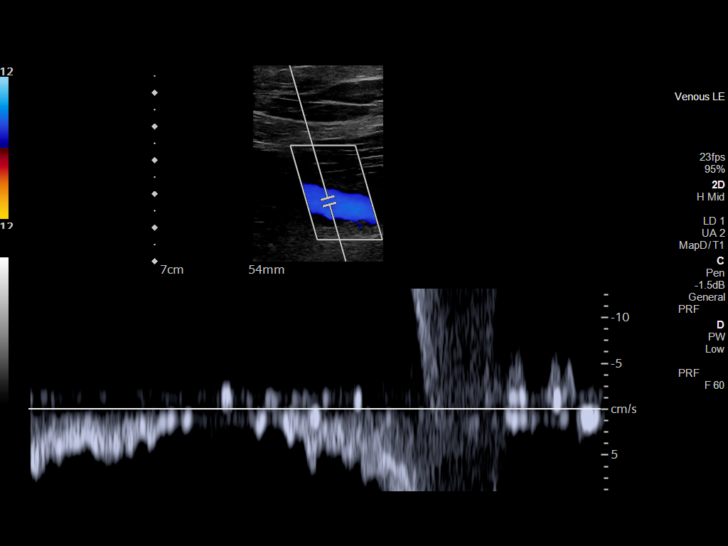
[im 18/42]
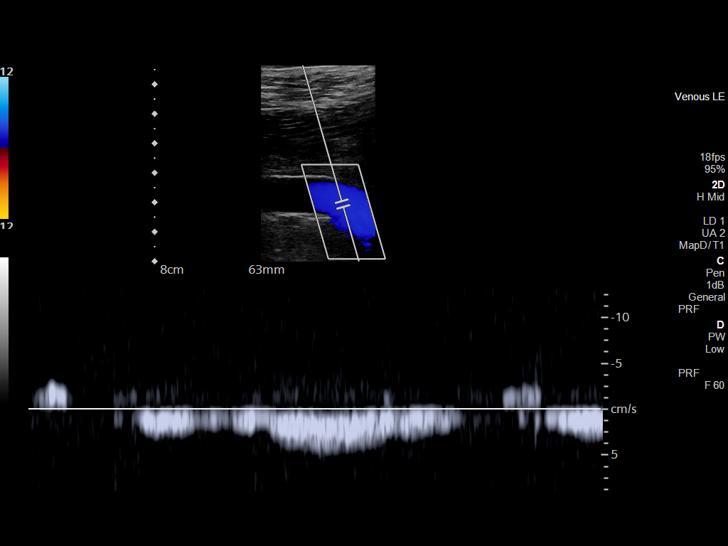
[im 22/42]
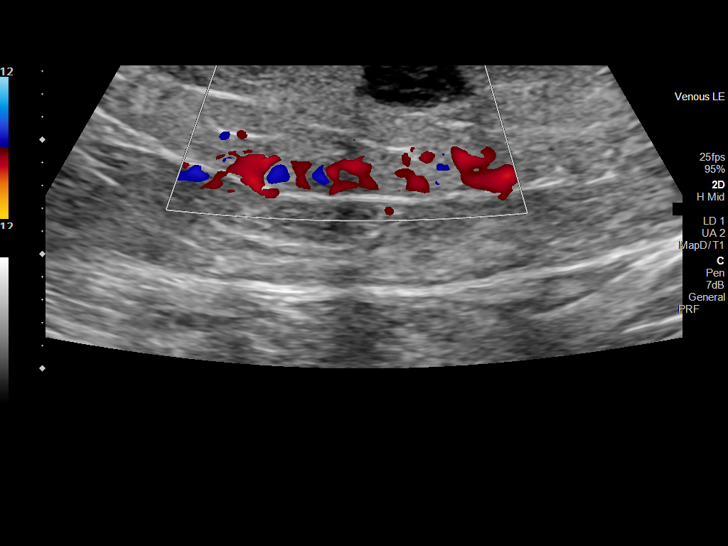
[im 24/42]
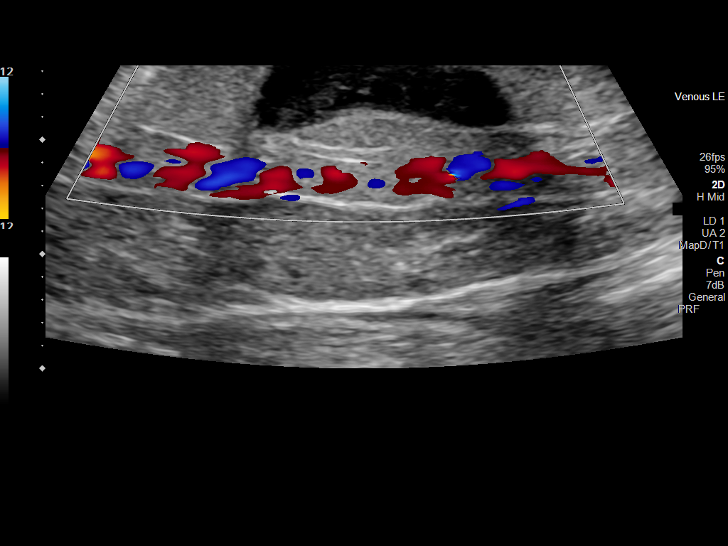
[im 27/42]
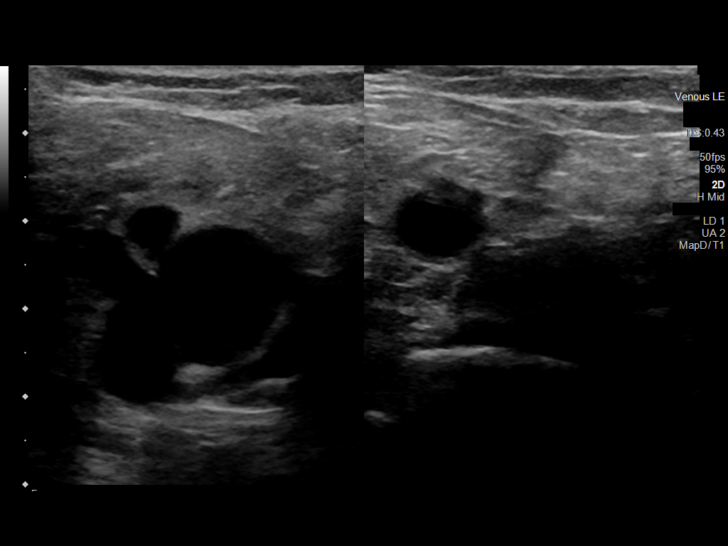
[im 31/42]
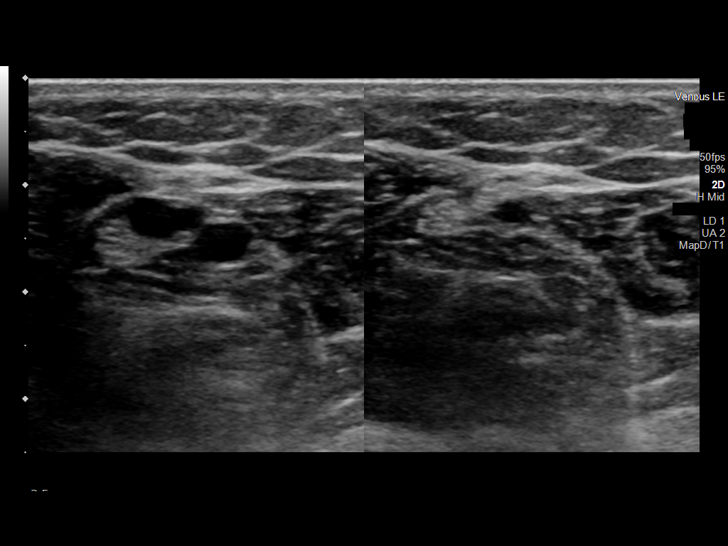
[im 34/42]
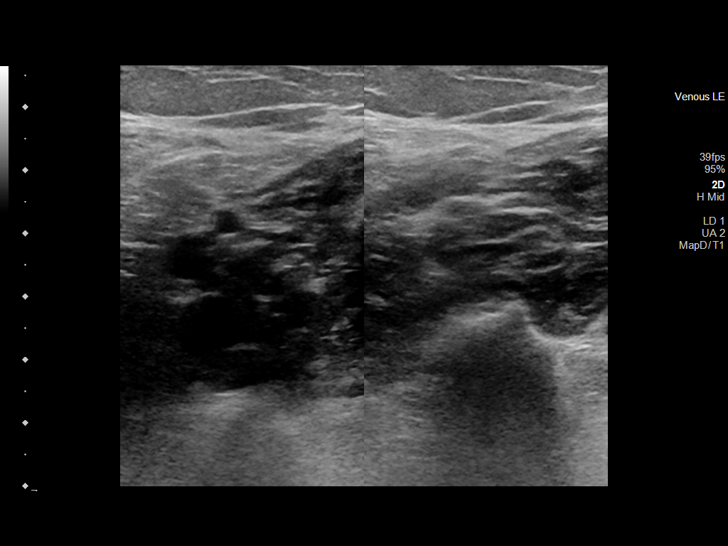
[im 38/42]
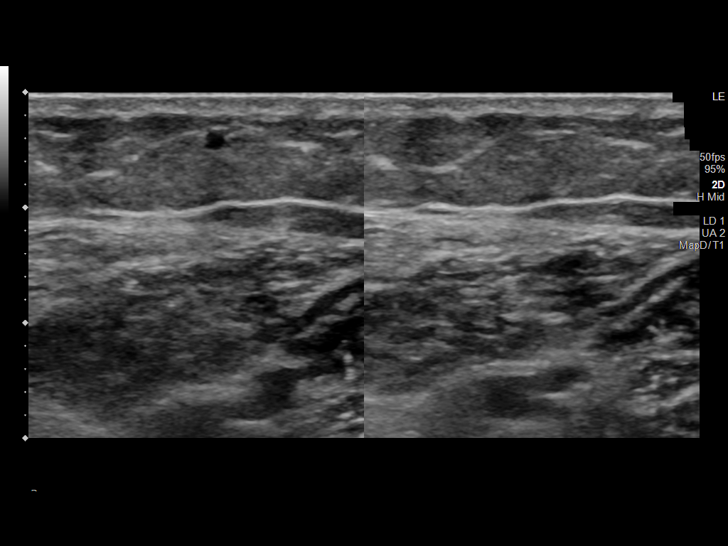
[im 42/42]
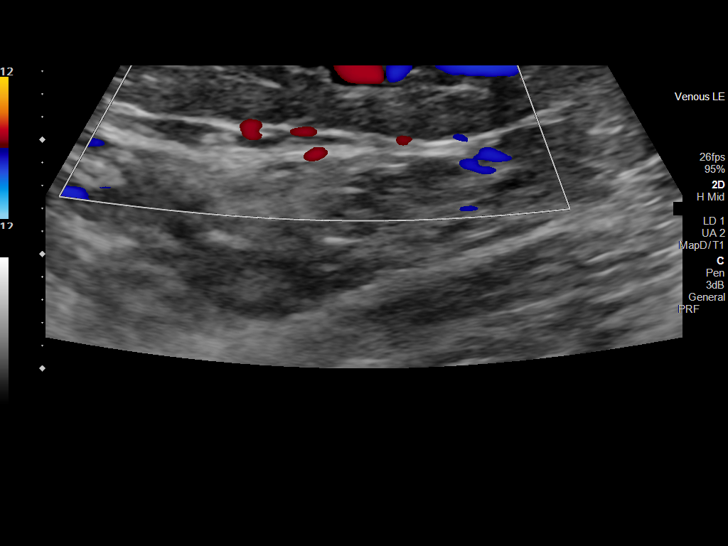

[13 of 24 positions shown; findings below may reference images not displayed]

FINDINGS: Contralateral Common Femoral Vein: Respiratory phasicity is normal
and symmetric with the symptomatic side. No evidence of thrombus.
Normal compressibility.

Common Femoral Vein: No evidence of thrombus. Normal
compressibility, respiratory phasicity and response to augmentation.

Saphenofemoral Junction: No evidence of thrombus. Normal
compressibility and flow on color Doppler imaging.

Profunda Femoral Vein: No evidence of thrombus. Normal
compressibility and flow on color Doppler imaging.

Femoral Vein: No evidence of thrombus. Normal compressibility,
respiratory phasicity and response to augmentation.

Popliteal Vein: No evidence of thrombus. Normal compressibility,
respiratory phasicity and response to augmentation.

Calf Veins: No evidence of thrombus. Normal compressibility and flow
on color Doppler imaging.

Superficial Great Saphenous Vein: Superficial thrombophlebitis is
noted within the lower thigh with tortuosity.

Venous Reflux:  None.

Other Findings:  None.
IMPRESSION: No evidence of deep venous thrombosis.

Superficial thrombophlebitis is noted within the greater saphenous
vein in the more distal aspect of thigh.

## 2023-03-06 DIAGNOSIS — H43392 Other vitreous opacities, left eye: Secondary | ICD-10-CM | POA: Diagnosis not present

## 2023-03-06 DIAGNOSIS — H43812 Vitreous degeneration, left eye: Secondary | ICD-10-CM | POA: Diagnosis not present

## 2023-06-08 DIAGNOSIS — E782 Mixed hyperlipidemia: Secondary | ICD-10-CM | POA: Diagnosis not present

## 2023-06-08 DIAGNOSIS — Z79899 Other long term (current) drug therapy: Secondary | ICD-10-CM | POA: Diagnosis not present

## 2023-06-08 DIAGNOSIS — Z125 Encounter for screening for malignant neoplasm of prostate: Secondary | ICD-10-CM | POA: Diagnosis not present

## 2023-07-01 DIAGNOSIS — L728 Other follicular cysts of the skin and subcutaneous tissue: Secondary | ICD-10-CM | POA: Diagnosis not present

## 2023-07-02 ENCOUNTER — Ambulatory Visit: Payer: Medicare HMO | Admitting: Adult Health

## 2023-07-02 VITALS — BP 138/62 | HR 62 | Ht 70.0 in | Wt 237.0 lb

## 2023-07-02 DIAGNOSIS — G35 Multiple sclerosis: Secondary | ICD-10-CM | POA: Diagnosis not present

## 2023-07-02 MED ORDER — LEVETIRACETAM 750 MG PO TABS: 750.0000 mg | ORAL_TABLET | Freq: Two times a day (BID) | ORAL | 3 refills | Status: AC

## 2023-07-02 NOTE — Progress Notes (Signed)
 Chart reviewed, agree above plan ?

## 2023-07-02 NOTE — Progress Notes (Signed)
 PATIENT: Troy Carney DOB: 04/30/1955  REASON FOR VISIT: follow up HISTORY FROM: patient  Chief Complaint  Patient presents with   Follow-up    Pt alone, rm 2.here for follow up visit. Overall stable. Denies any issues or concerns. No DMT and denies any new seizure activity     HISTORY OF PRESENT ILLNESS: Today 07/02/23:  Troy Carney is a 68 y.o. male with a history of seizures and multiple sclerosis. Returns today for follow-up.  He reports that he has not had any seizure events.  Remains on Keppra 750 mg twice a day.  Tolerates the medication well.  No change in mood or behavior.  He has been off Avonex since 2022.  Last MRI in 2023 was stable.  Per previous note from Dr. Anne Hahn he would like to repeat MRIs annually for 3 years and if stable can repeat periodically after that.  Patient denies any additional symptoms associated with MS.  Overall he feels that he has been relatively stable.   07/03/22: Troy Carney is a 68 y.o. male with a history of Seizures and MS. Returns today for follow-up.  Reports he has not had any seizure events.  Remains on Keppra 750 mg twice a day.  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  In regards to multiple sclerosis his scan last year was stable.  He has not been on any disease modifying therapy in the last 2 years.  Denies any new symptoms.  Returns today for an evaluation.    06/18/21: Troy Carney is a 68 year old male history of seizures and multiple sclerosis.  He returns today for follow-up.  Seizures: Denies any seizure events.  Remains on Keppra 750 mg twice a day.  Operates a motor vehicle without difficulty. Lives at home with his wife- completed all ADLs independently.  MS: Off Avonex since MRI last year. Reports that he had one morning that he had trouble with his balance that lasted 1 hour then went away. No changes with bowel and bladder. No other changes with gait or balance. No changes with vision. Reports that he had back  pain about 1 month ago- woke up like that- had trouble bending over. Saw his PCP they gave his a muscle relaxer and anti-inflammatory thought it was consistent with Sciatica and told her to FU with [place] if needed (per patient).  06/13/20: Troy Carney is a 68 year old male with a history of seizures and multiple sclerosis.  He returns today for follow-up.  Overall he feels that he is remained stable.  Denies any changes with his gait or balance.  Denies any new numbness or weakness.  No changes in his vision.  Reports that he is sleeping well.  Denies any seizure events.  Continues on Avonex and Keppra.  Last MRI of the brain was in 2019.  06/14/19: Troy Carney is a 68 year old male with a history of seizures and multiple sclerosis.  He returns today for follow-up.  He denies any seizure events.  Continues on Keppra.  He remains on Avonex and tolerating it well.  No change in his gait or balance.  No new numbness or weakness.  No changes with the bowels or bladder.  No vision changes.  Returns today for follow-up  HISTORY 03/31/18:   Troy Carney is a 68 year old male with a history of seizures and multiple sclerosis.  He returns today for follow-up.  He denies any new numbness or tingling.  Denies any weakness in the extremities.  No change in his gait or balance.  No change of the bowels or bladder.  No changes to her vision.  He continues on Avonex.  He had an MRI in 2019 that did not show any new active lesions.  He continues on Keppra for seizures.  He denies any seizure events.  He operates a Librarian, academic.  He returns today for evaluation.  REVIEW OF SYSTEMS: Out of a complete 14 system review of symptoms, the patient complains only of the following symptoms, and all other reviewed systems are negative.  See HPI  ALLERGIES: No Known Allergies  HOME MEDICATIONS: Outpatient Medications Prior to Visit  Medication Sig Dispense Refill   aspirin EC 81 MG tablet Take 81 mg by mouth daily. Swallow whole.      levETIRAcetam (KEPPRA) 750 MG tablet Take 1 tablet (750 mg total) by mouth 2 (two) times daily. 180 tablet 3   methocarbamol (ROBAXIN) 750 MG tablet Take 750 mg by mouth every 6 (six) hours as needed.     rosuvastatin (CRESTOR) 5 MG tablet Take 5 mg by mouth daily.     sertraline (ZOLOFT) 100 MG tablet TAKE 1 AND 1/2 TABLETS DAILY BY MOUTH 135 tablet 3   tamsulosin (FLOMAX) 0.4 MG CAPS capsule Take 0.8 mg by mouth daily.     amoxicillin-clavulanate (AUGMENTIN) 875-125 MG tablet Take 1 tablet by mouth every 12 (twelve) hours. 14 tablet 0   No facility-administered medications prior to visit.    PAST MEDICAL HISTORY: Past Medical History:  Diagnosis Date   Depression    Multiple sclerosis (HCC)    Phlebitis and thombophlb of superfic vessels of l low extrem    Seizures (HCC) 03/28/2013   Dystonic    PAST SURGICAL HISTORY: Past Surgical History:  Procedure Laterality Date   NASAL FRACTURE SURGERY      FAMILY HISTORY: Family History  Problem Relation Age of Onset   COPD Mother    Bladder Cancer Father    Asthma Father    Testicular cancer Brother    Multiple sclerosis Son    Multiple sclerosis Son     SOCIAL HISTORY: Social History   Socioeconomic History   Marital status: Married    Spouse name: Clydie Braun   Number of children: 2   Years of education: 16   Highest education level: Not on file  Occupational History   Occupation: Art gallery manager  Tobacco Use   Smoking status: Never   Smokeless tobacco: Never  Vaping Use   Vaping status: Never Used  Substance and Sexual Activity   Alcohol use: No   Drug use: No   Sexual activity: Not on file  Other Topics Concern   Not on file  Social History Narrative   Patient is married Clydie Braun) and lives at home with his wife.   Patient has two children.   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   Patient is right-handed.   Patient drinks three caffeine drinks daily.   Social Drivers of Research scientist (physical sciences) Strain: Not on file  Food Insecurity: Not on file  Transportation Needs: Not on file  Physical Activity: Not on file  Stress: Not on file  Social Connections: Not on file  Intimate Partner Violence: Not on file      PHYSICAL EXAM  Vitals:   07/02/23 1101  BP: 138/62  Pulse: 62  Weight: 237 lb (107.5 kg)  Height: 5\' 10"  (1.778 m)   Body mass index is 34.01  kg/m.  Generalized: Well developed, in no acute distress   Neurological examination  Mentation: Alert oriented to time, place, history taking. Follows all commands speech and language fluent Cranial nerve II-XII: Pupils were equal round reactive to light. Extraocular movements were full, visual field were full on confrontational test. Facial sensation and strength were normal. Head turning and shoulder shrug  were normal and symmetric. Motor: The motor testing reveals 5 over 5 strength of all 4 extremities. Good symmetric motor tone is noted throughout.  Sensory: Sensory testing is intact to soft touch on all 4 extremities. No evidence of extinction is noted.  Coordination: Cerebellar testing reveals good finger-nose-finger and heel-to-shin bilaterally.  Gait and station: Gait is normal  DIAGNOSTIC DATA (LABS, IMAGING, TESTING) - I reviewed patient records, labs, notes, testing and imaging myself where available.  Lab Results  Component Value Date   WBC 8.2 04/18/2022   HGB 13.3 04/18/2022   HCT 40.4 04/18/2022   MCV 91.6 04/18/2022   PLT 228 04/18/2022      Component Value Date/Time   NA 136 04/18/2022 1339   NA 141 06/14/2019 1219   K 3.7 04/18/2022 1339   CL 102 04/18/2022 1339   CO2 24 04/18/2022 1339   GLUCOSE 115 (H) 04/18/2022 1339   BUN 14 04/18/2022 1339   BUN 17 06/14/2019 1219   CREATININE 0.86 04/18/2022 1339   CALCIUM 8.5 (L) 04/18/2022 1339   PROT 6.5 04/18/2022 1339   PROT 6.6 06/14/2019 1219   ALBUMIN 3.6 04/18/2022 1339   ALBUMIN 4.4 06/14/2019 1219   AST 21 04/18/2022 1339   ALT  20 04/18/2022 1339   ALKPHOS 78 04/18/2022 1339   BILITOT 0.6 04/18/2022 1339   BILITOT 0.3 06/14/2019 1219   GFRNONAA >60 04/18/2022 1339   GFRAA 109 06/14/2019 1219      ASSESSMENT AND PLAN 68 y.o. year old male  has a past medical history of Depression, Multiple sclerosis (HCC), Phlebitis and thombophlb of superfic vessels of l low extrem, and Seizures (HCC) (03/28/2013). here with:  1.  Multiple sclerosis  - Stable - Will repeat MRI of the brain with and without contrast   2.  Seizures  -Continue Keppra 750 mg twice a day    Advised if symptoms worsen or he develops any new symptoms he should let us know.  Follow-up in 1 year or sooner if needed  Previous patient of Dr. wills.  His next visit he will establish with another MD in our office    Butch Penny, MSN, NP-C 07/02/2023, 11:06 AM [facility] [address], [address] [place], [place] [address] [phone]

## 2023-07-02 NOTE — Patient Instructions (Signed)
 Your Plan:  Continue Keppra 750 mg BID MRI brain w/wo contrast to evaluate for MS If your symptoms worsen or you develop new symptoms please let us know.    Thank you for coming to see [place] at [facility]. I hope we have been able to provide you high quality care today.  You may receive a patient satisfaction survey over the next few weeks. We would appreciate your feedback and comments so that we may continue to improve ourselves and the health of our patients.

## 2023-07-03 ENCOUNTER — Telehealth: Payer: Self-pay | Admitting: Adult Health

## 2023-07-03 NOTE — Telephone Encounter (Signed)
 sent to GI they obtain [facility] Berkley Harvey [phone]

## 2023-07-09 DIAGNOSIS — D649 Anemia, unspecified: Secondary | ICD-10-CM | POA: Diagnosis not present

## 2023-07-15 DIAGNOSIS — D649 Anemia, unspecified: Secondary | ICD-10-CM | POA: Diagnosis not present

## 2023-07-16 DIAGNOSIS — D509 Iron deficiency anemia, unspecified: Secondary | ICD-10-CM | POA: Diagnosis not present

## 2023-07-20 DIAGNOSIS — D509 Iron deficiency anemia, unspecified: Secondary | ICD-10-CM | POA: Diagnosis not present

## 2023-07-21 ENCOUNTER — Encounter: Payer: Self-pay | Admitting: Adult Health

## 2023-07-28 ENCOUNTER — Encounter: Payer: Self-pay | Admitting: Adult Health

## 2023-07-29 ENCOUNTER — Encounter: Payer: Self-pay | Admitting: Adult Health

## 2023-08-10 ENCOUNTER — Ambulatory Visit
Admission: RE | Admit: 2023-08-10 | Discharge: 2023-08-10 | Disposition: A | Discharge status: Home / Self Care | Source: Ambulatory Visit | Attending: Adult Health | Admitting: Adult Health

## 2023-08-10 ENCOUNTER — Ambulatory Visit: Payer: Self-pay | Admitting: Adult Health

## 2023-08-10 DIAGNOSIS — G35 Multiple sclerosis: Secondary | ICD-10-CM

## 2023-08-10 MED ORDER — GADOPICLENOL 0.5 MMOL/ML IV SOLN
10.0000 mL | Freq: Once | INTRAVENOUS | Status: AC | PRN
  Administered 2023-08-10: 10 mL via INTRAVENOUS

## 2023-08-14 DIAGNOSIS — D509 Iron deficiency anemia, unspecified: Secondary | ICD-10-CM | POA: Diagnosis not present

## 2023-08-19 DIAGNOSIS — D509 Iron deficiency anemia, unspecified: Secondary | ICD-10-CM | POA: Diagnosis not present

## 2023-10-14 ENCOUNTER — Other Ambulatory Visit: Payer: Self-pay | Admitting: Adult Health

## 2023-11-04 DIAGNOSIS — H5213 Myopia, bilateral: Secondary | ICD-10-CM | POA: Diagnosis not present

## 2023-11-19 DIAGNOSIS — D509 Iron deficiency anemia, unspecified: Secondary | ICD-10-CM | POA: Diagnosis not present

## 2023-12-20 DIAGNOSIS — R051 Acute cough: Secondary | ICD-10-CM | POA: Diagnosis not present

## 2023-12-20 DIAGNOSIS — U071 COVID-19: Secondary | ICD-10-CM | POA: Diagnosis not present

## 2023-12-20 DIAGNOSIS — R0981 Nasal congestion: Secondary | ICD-10-CM | POA: Diagnosis not present

## 2024-01-11 ENCOUNTER — Other Ambulatory Visit: Payer: Self-pay | Admitting: Neurology

## 2024-02-24 DIAGNOSIS — D509 Iron deficiency anemia, unspecified: Secondary | ICD-10-CM | POA: Diagnosis not present

## 2024-07-04 ENCOUNTER — Ambulatory Visit: Admitting: Neurology
